# Patient Record
Sex: Male | Born: 1985 | Race: White | Hispanic: No | Marital: Married | Smoking: Never smoker
Health system: Southern US, Community
[De-identification: ages and names within clinical notes are randomized; demographics above are authoritative.]

---

## 2003-02-14 ENCOUNTER — Emergency Department (HOSPITAL_COMMUNITY): Admission: EM | Admit: 2003-02-14 | Discharge: 2003-02-14 | Payer: Self-pay | Admitting: Emergency Medicine

## 2004-01-04 ENCOUNTER — Emergency Department (HOSPITAL_COMMUNITY): Admission: EM | Admit: 2004-01-04 | Discharge: 2004-01-04 | Payer: Self-pay | Admitting: Emergency Medicine

## 2014-08-11 ENCOUNTER — Ambulatory Visit (INDEPENDENT_AMBULATORY_CARE_PROVIDER_SITE_OTHER): Payer: 59 | Admitting: Family Medicine

## 2014-08-11 VITALS — BP 132/86 | HR 85 | Temp 98.1°F | Resp 18 | Ht 70.75 in | Wt 200.4 lb

## 2014-08-11 DIAGNOSIS — R05 Cough: Secondary | ICD-10-CM

## 2014-08-11 DIAGNOSIS — R059 Cough, unspecified: Secondary | ICD-10-CM

## 2014-08-11 DIAGNOSIS — J209 Acute bronchitis, unspecified: Secondary | ICD-10-CM

## 2014-08-11 MED ORDER — HYDROCOD POLST-CHLORPHEN POLST 10-8 MG/5ML PO LQCR: 5.0000 mL | Freq: Every evening | ORAL | Status: DC | PRN

## 2014-08-11 NOTE — Progress Notes (Signed)
   Subjective:    Patient ID: Keith Hamilton, male    DOB: 02/23/1986, 28 y.o.   MRN: [id number]  HPI Patient with 1 1/2 weeks of clear nasal drainage/congestion and chest congestion. His cough has decreased over time, is now occasional with little sputum. He has some upper back pain with cough. Has had a feeling of tickling in his throat. Has taken some mucinex with some temporary relief. Has these symptoms in the spring and fall every year.   No past medical history on file. No past surgical history on file. No family history on file. History  Substance Use Topics  . Smoking status: Never Smoker   . Smokeless tobacco: Not on file  . Alcohol Use: No    Review of Systems No fever/chills, some sore throat at beginning, no ear pain, no persistent headache, some slight wheezing last night, no SOB, no chest pain. No muscle aches, no fatigue.     Objective:   Physical Exam  Vitals reviewed. Constitutional: He is oriented to person, place, and time. He appears well-developed and well-nourished.  HENT:  Head: Normocephalic and atraumatic.  Right Ear: Tympanic membrane, external ear and ear canal normal.  Left Ear: Tympanic membrane, external ear and ear canal normal.  Nose: Mucosal edema and rhinorrhea present. Right sinus exhibits no maxillary sinus tenderness and no frontal sinus tenderness. Left sinus exhibits no maxillary sinus tenderness and no frontal sinus tenderness.  Mouth/Throat: Uvula is midline. Posterior oropharyngeal erythema present. No oropharyngeal exudate, posterior oropharyngeal edema or tonsillar abscesses.  Nasal congestion and post nasal drainage present.    Eyes: Conjunctivae are normal.  Neck: Normal range of motion. Neck supple.  Cardiovascular: Normal rate, regular rhythm and normal heart sounds.   Pulmonary/Chest: Effort normal and breath sounds normal.  Musculoskeletal: Normal range of motion.  Lymphadenopathy:    He has no cervical adenopathy.    Neurological: He is alert and oriented to person, place, and time.  Skin: Skin is warm and dry.  Psychiatric: He has a normal mood and affect. His behavior is normal. Judgment and thought content normal.      Assessment & Plan:  1. Cough - chlorpheniramine-HYDROcodone (TUSSIONEX PENNKINETIC ER) 10-8 MG/5ML LQCR; Take 5 mLs by mouth at bedtime as needed.  Dispense: 70 mL; Refill: 0  2. Acute bronchitis, unspecified organism- this likely viral in healthy, 28 yo, nonsmoker. Provided written and verbal information regarding diagnosis and treatment. Patient instructions- Decongestant twice a day- morning and early afternoon Afrin nasal spray as directed on box up to 4 days Delsym cough syrup during day, prescription cough syrup at night Drink lots of fluids  -RTC if no improvement in 4-5 days, sooner if worsening symptoms- fever/chills, SOB  Emi Belfast, FNP-BC  [facility]  08/11/2014 8:02 PM 22

## 2014-08-11 NOTE — Patient Instructions (Signed)
Decongestant twice a day- morning and early afternoon Afrin nasal spray as directed on box up to 4 days Delsym cough syrup during day, prescription cough syrup at night Drink lots of fluids Acute Bronchitis Bronchitis is inflammation of the airways that extend from the windpipe into the lungs (bronchi). The inflammation often causes mucus to develop. This leads to a cough, which is the most common symptom of bronchitis.  In acute bronchitis, the condition usually develops suddenly and goes away over time, usually in a couple weeks. Smoking, allergies, and asthma can make bronchitis worse. Repeated episodes of bronchitis may cause further lung problems.  CAUSES Acute bronchitis is most often caused by the same virus that causes a cold. The virus can spread from person to person (contagious) through coughing, sneezing, and touching contaminated objects. SIGNS AND SYMPTOMS   Cough.   Fever.   Coughing up mucus.   Body aches.   Chest congestion.   Chills.   Shortness of breath.   Sore throat.  DIAGNOSIS  Acute bronchitis is usually diagnosed through a physical exam. Your health care provider will also ask you questions about your medical history. Tests, such as chest X-rays, are sometimes done to rule out other conditions.  TREATMENT  Acute bronchitis usually goes away in a couple weeks. Oftentimes, no medical treatment is necessary. Medicines are sometimes given for relief of fever or cough. Antibiotic medicines are usually not needed but may be prescribed in certain situations. In some cases, an inhaler may be recommended to help reduce shortness of breath and control the cough. A cool mist vaporizer may also be used to help thin bronchial secretions and make it easier to clear the chest.  HOME CARE INSTRUCTIONS  Get plenty of rest.   Drink enough fluids to keep your urine clear or pale yellow (unless you have a medical condition that requires fluid restriction). Increasing  fluids may help thin your respiratory secretions (sputum) and reduce chest congestion, and it will prevent dehydration.   Take medicines only as directed by your health care provider.  If you were prescribed an antibiotic medicine, finish it all even if you start to feel better.  Avoid smoking and secondhand smoke. Exposure to cigarette smoke or irritating chemicals will make bronchitis worse. If you are a smoker, consider using nicotine gum or skin patches to help control withdrawal symptoms. Quitting smoking will help your lungs heal faster.   Reduce the chances of another bout of acute bronchitis by washing your hands frequently, avoiding people with cold symptoms, and trying not to touch your hands to your mouth, nose, or eyes.   Keep all follow-up visits as directed by your health care provider.  SEEK MEDICAL CARE IF: Your symptoms do not improve after 1 week of treatment.  SEEK IMMEDIATE MEDICAL CARE IF:  You develop an increased fever or chills.   You have chest pain.   You have severe shortness of breath.  You have bloody sputum.   You develop dehydration.  You faint or repeatedly feel like you are going to pass out.  You develop repeated vomiting.  You develop a severe headache. MAKE SURE YOU:   Understand these instructions.  Will watch your condition.  Will get help right away if you are not doing well or get worse. Document Released: 12/08/2004 Document Revised: 03/17/2014 Document Reviewed: 04/23/2013 [facility] Patient Information 2015 [place], [place]. This information is not intended to replace advice given to you by your health care provider. Make sure you discuss  any questions you have with your health care provider.  

## 2017-06-19 ENCOUNTER — Encounter: Payer: Self-pay | Admitting: Emergency Medicine

## 2017-06-19 ENCOUNTER — Ambulatory Visit (INDEPENDENT_AMBULATORY_CARE_PROVIDER_SITE_OTHER): Payer: Managed Care, Other (non HMO) | Admitting: Emergency Medicine

## 2017-06-19 ENCOUNTER — Ambulatory Visit (INDEPENDENT_AMBULATORY_CARE_PROVIDER_SITE_OTHER): Payer: Managed Care, Other (non HMO)

## 2017-06-19 VITALS — BP 110/72 | HR 97 | Temp 99.4°F | Resp 16 | Ht 70.5 in | Wt 206.4 lb

## 2017-06-19 DIAGNOSIS — R091 Pleurisy: Secondary | ICD-10-CM | POA: Diagnosis not present

## 2017-06-19 DIAGNOSIS — B999 Unspecified infectious disease: Secondary | ICD-10-CM

## 2017-06-19 DIAGNOSIS — B349 Viral infection, unspecified: Secondary | ICD-10-CM

## 2017-06-19 DIAGNOSIS — R5081 Fever presenting with conditions classified elsewhere: Secondary | ICD-10-CM | POA: Diagnosis not present

## 2017-06-19 DIAGNOSIS — R0781 Pleurodynia: Secondary | ICD-10-CM | POA: Diagnosis not present

## 2017-06-19 DIAGNOSIS — M546 Pain in thoracic spine: Secondary | ICD-10-CM | POA: Diagnosis not present

## 2017-06-19 LAB — POCT CBC
Granulocyte percent: 64 %G (ref 37–80)
HCT, POC: 42.6 % — AB (ref 43.5–53.7)
Hemoglobin: 14.5 g/dL (ref 14.1–18.1)
Lymph, poc: 1.6 (ref 0.6–3.4)
MCH, POC: 30.9 pg (ref 27–31.2)
MCHC: 34.1 g/dL (ref 31.8–35.4)
MCV: 90.6 fL (ref 80–97)
MID (CBC): 0.4 (ref 0–0.9)
MPV: 7.6 fL (ref 0–99.8)
PLATELET COUNT, POC: 269 10*3/uL (ref 142–424)
POC Granulocyte: 3.6 (ref 2–6.9)
POC LYMPH PERCENT: 28.5 %L (ref 10–50)
POC MID %: 7.5 %M (ref 0–12)
RBC: 4.7 M/uL (ref 4.69–6.13)
RDW, POC: 12.9 %
WBC: 5.7 10*3/uL (ref 4.6–10.2)

## 2017-06-19 MED ORDER — AZITHROMYCIN 250 MG PO TABS: ORAL_TABLET | ORAL | 0 refills | Status: DC

## 2017-06-19 MED ORDER — PREDNISONE 20 MG PO TABS: 40.0000 mg | ORAL_TABLET | Freq: Every day | ORAL | 0 refills | Status: AC

## 2017-06-19 NOTE — Patient Instructions (Addendum)
     IF you received an x-ray today, you will receive an invoice from [facility]. Please contact [facility] at [phone] with questions or concerns regarding your invoice.   IF you received labwork today, you will receive an invoice from [facility]. Please contact [facility] at [phone] with questions or concerns regarding your invoice.   Our billing staff will not be able to assist you with questions regarding bills from these companies.  You will be contacted with the lab results as soon as they are available. The fastest way to get your results is to activate your My Chart account. Instructions are located on the last page of this paperwork. If you have not heard from us regarding the results in 2 weeks, please contact this office.      Pleurisy Pleurisy is irritation and swelling (inflammation) of the linings of your lungs (pleura). This can cause pain in your chest, back, or shoulder. It can also cause trouble breathing. Follow these instructions at home: Medicines  Take over-the-counter and prescription medicines only as told by your doctor.  If you were prescribed antibiotic medicine, take it as told by your doctor. Do not stop taking the antibiotic even if you start to feel better. Activity  Rest and return to your normal activities as told by your doctor. Ask your doctor what activities are safe for you.  Do not drive or use heavy machinery while taking prescription pain medicine. General instructions  Watch for any changes in your condition.  Take deep breaths often, even if it is painful. This can help prevent lung problems.  When lying down, lie on your painful side. This may help you feel less pain.  Do not smoke. If you need help quitting, ask your doctor.  Keep all follow-up visits as told by your doctor. This is important. Contact a doctor if:  You have pain that:  Gets worse.  Does not get better with medicine.  Lasts for more than  1 week.  You have a fever or chills.  You have a cough that does not get better at home.  You have trouble breathing that does not get better at home.  You cough up liquid that looks like pus (purulent secretions). Get help right away if:  Your lips, fingernails, or toenails turn dark or turn blue.  You cough up blood.  You have trouble breathing that gets worse.  You are making loud noises when you breathe (wheezing) and this gets worse.  You have pain that spreads to your neck, arms, or jaw.  You get a rash.  You throw up (vomit).  You pass out (faint). Summary  Pleurisy is irritation and swelling (inflammation) of the linings of your lungs (pleura).  Pleurisy can cause pain and trouble breathing.  If you have a cough that does not get better at home, contact your doctor.  Get help right away if you are having trouble breathing and it is getting worse. This information is not intended to replace advice given to you by your health care provider. Make sure you discuss any questions you have with your health care provider. Document Released: 10/13/2008 Document Revised: 07/25/2016 Document Reviewed: 07/25/2016 Elsevier Interactive Patient Education  2017 Elsevier Inc.  

## 2017-06-19 NOTE — Progress Notes (Signed)
Lb

## 2017-06-19 NOTE — Progress Notes (Signed)
Keith Hamilton 31 y.o.   Chief Complaint  Patient presents with  . Back Pain    per patient more so in the chest cavity x 6 days  . Shortness of Breath    with lying down with fever (100.6 last night)    HISTORY OF PRESENT ILLNESS: This is a 31 y.o. male complaining of pain to torso, front and back, worse when lying down x 6 days; improves sitting up; developed fever yesterday; no other significant symptoms.  HPI   Prior to Admission medications   Not on File    No Known Allergies  There are no active problems to display for this patient.   No past medical history on file.  No past surgical history on file.  Social History   Social History  . Marital status: Married    Spouse name: N/A  . Number of children: N/A  . Years of education: N/A   Occupational History  . Not on file.   Social History Main Topics  . Smoking status: Never Smoker  . Smokeless tobacco: Never Used  . Alcohol use No  . Drug use: No  . Sexual activity: Not on file   Other Topics Concern  . Not on file   Social History Narrative  . No narrative on file    No family history on file.   Review of Systems  Constitutional: Positive for fever and malaise/fatigue. Negative for chills and weight loss.  HENT: Positive for sore throat (better now). Negative for nosebleeds.   Eyes: Negative.  Negative for discharge and redness.  Respiratory: Negative.  Negative for cough, hemoptysis and shortness of breath.   Cardiovascular: Positive for chest pain. Negative for palpitations and leg swelling.  Gastrointestinal: Negative.  Negative for abdominal pain, diarrhea, nausea and vomiting.  Genitourinary: Negative.  Negative for dysuria and hematuria.  Musculoskeletal: Positive for back pain. Negative for joint pain and myalgias.  Skin: Negative.  Negative for rash.  Neurological: Positive for weakness. Negative for dizziness and headaches.  Endo/Heme/Allergies: Negative.   All other systems  reviewed and are negative.  Vitals:   06/19/17 0954  BP: 110/72  Pulse: 97  Resp: 16  Temp: 99.4 F (37.4 C)    Physical Exam  Constitutional: He is oriented to person, place, and time. He appears well-developed and well-nourished.  HENT:  Head: Normocephalic and atraumatic.  Nose: Nose normal.  Mouth/Throat: Oropharynx is clear and moist. No oropharyngeal exudate.  Eyes: Pupils are equal, round, and reactive to light. Conjunctivae and EOM are normal.  Neck: Normal range of motion. Neck supple. No JVD present. No thyromegaly present.  Cardiovascular: Normal rate, regular rhythm, normal heart sounds and intact distal pulses.   Pulmonary/Chest: Effort normal and breath sounds normal.  Abdominal: Soft. Bowel sounds are normal. He exhibits no distension and no mass. There is no tenderness. There is no rebound.  Musculoskeletal: Normal range of motion.  Lymphadenopathy:    He has no cervical adenopathy.  Neurological: He is alert and oriented to person, place, and time. No sensory deficit. He exhibits normal muscle tone.  Skin: Skin is warm and dry. Capillary refill takes less than 2 seconds. No rash noted.  Psychiatric: He has a normal mood and affect. His behavior is normal.  Vitals reviewed.  Dg Chest 2 View  Result Date: 06/19/2017 CLINICAL DATA:  Pleuritic chest pain EXAM: CHEST  2 VIEW COMPARISON:  12/20/2013 FINDINGS: The heart size and mediastinal contours are within normal limits. Both lungs  are clear. The visualized skeletal structures are unremarkable. IMPRESSION: No active cardiopulmonary disease. Electronically Signed   By: Alcide CleverMark  Lukens M.D.   On: 06/19/2017 10:43   Results for orders placed or performed in visit on 06/19/17 (from the past 24 hour(s))  POCT CBC     Status: Abnormal   Collection Time: 06/19/17 10:56 AM  Result Value Ref Range   WBC 5.7 4.6 - 10.2 K/uL   Lymph, poc 1.6 0.6 - 3.4   POC LYMPH PERCENT 28.5 10 - 50 %L   MID (cbc) 0.4 0 - 0.9   POC MID % 7.5  0 - 12 %M   POC Granulocyte 3.6 2 - 6.9   Granulocyte percent 64.0 37 - 80 %G   RBC 4.70 4.69 - 6.13 M/uL   Hemoglobin 14.5 14.1 - 18.1 g/dL   HCT, POC 09.842.6 (A) 11.943.5 - 53.7 %   MCV 90.6 80 - 97 fL   MCH, POC 30.9 27 - 31.2 pg   MCHC 34.1 31.8 - 35.4 g/dL   RDW, POC 14.712.9 %   Platelet Count, POC 269 142 - 424 K/uL   MPV 7.6 0 - 99.8 fL   EKG: NSR, no signs of pericarditis.  ASSESSMENT & PLAN: Christiane HaJonathan was seen today for back pain and shortness of breath.  Diagnoses and all orders for this visit:  Pleuritic chest pain -     Comprehensive metabolic panel -     POCT CBC -     DG Chest 2 View; Future -     EKG 12-Lead  Pleurisy  Acute bilateral thoracic back pain  Fever due to infection  Viral illness  Other orders -     azithromycin (ZITHROMAX) 250 MG tablet; Sig as indicated -     predniSONE (DELTASONE) 20 MG tablet; Take 2 tablets (40 mg total) by mouth daily with breakfast.    Patient Instructions       IF you received an x-ray today, you will receive an invoice from [facility]. Please contact [facility] at [phone] with questions or concerns regarding your invoice.   IF you received labwork today, you will receive an invoice from [facility]. Please contact [facility] at [phone] with questions or concerns regarding your invoice.   Our billing staff will not be able to assist you with questions regarding bills from these companies.  You will be contacted with the lab results as soon as they are available. The fastest way to get your results is to activate your My Chart account. Instructions are located on the last page of this paperwork. If you have not heard from us regarding the results in 2 weeks, please contact this office.     Pleurisy Pleurisy is irritation and swelling (inflammation) of the linings of your lungs (pleura). This can cause pain in your chest, back, or shoulder. It can also cause trouble breathing. Follow these  instructions at home: Medicines  Take over-the-counter and prescription medicines only as told by your doctor.  If you were prescribed antibiotic medicine, take it as told by your doctor. Do not stop taking the antibiotic even if you start to feel better. Activity  Rest and return to your normal activities as told by your doctor. Ask your doctor what activities are safe for you.  Do not drive or use heavy machinery while taking prescription pain medicine. General instructions  Watch for any changes in your condition.  Take deep breaths often, even if it is painful. This can help  prevent lung problems.  When lying down, lie on your painful side. This may help you feel less pain.  Do not smoke. If you need help quitting, ask your doctor.  Keep all follow-up visits as told by your doctor. This is important. Contact a doctor if:  You have pain that: ? Gets worse. ? Does not get better with medicine. ? Lasts for more than 1 week.  You have a fever or chills.  You have a cough that does not get better at home.  You have trouble breathing that does not get better at home.  You cough up liquid that looks like pus (purulent secretions). Get help right away if:  Your lips, fingernails, or toenails turn dark or turn blue.  You cough up blood.  You have trouble breathing that gets worse.  You are making loud noises when you breathe (wheezing) and this gets worse.  You have pain that spreads to your neck, arms, or jaw.  You get a rash.  You throw up (vomit).  You pass out (faint). Summary  Pleurisy is irritation and swelling (inflammation) of the linings of your lungs (pleura).  Pleurisy can cause pain and trouble breathing.  If you have a cough that does not get better at home, contact your doctor.  Get help right away if you are having trouble breathing and it is getting worse. This information is not intended to replace advice given to you by your health care  provider. Make sure you discuss any questions you have with your health care provider. Document Released: 10/13/2008 Document Revised: 07/25/2016 Document Reviewed: 07/25/2016 Elsevier Interactive Patient Education  2017 Elsevier Inc.      Edwina Barth, MD [facility]

## 2017-06-20 LAB — COMPREHENSIVE METABOLIC PANEL
ALT: 17 IU/L (ref 0–44)
AST: 17 IU/L (ref 0–40)
Albumin/Globulin Ratio: 1.6 (ref 1.2–2.2)
Albumin: 4.5 g/dL (ref 3.5–5.5)
Alkaline Phosphatase: 75 IU/L (ref 39–117)
BUN/Creatinine Ratio: 13 (ref 9–20)
BUN: 15 mg/dL (ref 6–20)
Bilirubin Total: 0.6 mg/dL (ref 0.0–1.2)
CO2: 25 mmol/L (ref 20–29)
Calcium: 9.4 mg/dL (ref 8.7–10.2)
Chloride: 100 mmol/L (ref 96–106)
Creatinine, Ser: 1.19 mg/dL (ref 0.76–1.27)
GFR calc Af Amer: 92 mL/min/{1.73_m2} (ref >59)
GFR calc non Af Amer: 80 mL/min/{1.73_m2} (ref >59)
GLUCOSE: 78 mg/dL (ref 65–99)
Globulin, Total: 2.9 g/dL (ref 1.5–4.5)
Potassium: 3.5 mmol/L (ref 3.5–5.2)
Sodium: 140 mmol/L (ref 134–144)
TOTAL PROTEIN: 7.4 g/dL (ref 6.0–8.5)

## 2017-06-21 ENCOUNTER — Encounter: Payer: Self-pay | Admitting: Radiology

## 2017-08-25 ENCOUNTER — Encounter: Payer: Self-pay | Admitting: Emergency Medicine

## 2017-08-25 ENCOUNTER — Ambulatory Visit (INDEPENDENT_AMBULATORY_CARE_PROVIDER_SITE_OTHER): Payer: Managed Care, Other (non HMO) | Admitting: Emergency Medicine

## 2017-08-25 VITALS — BP 116/74 | HR 74 | Temp 98.8°F | Resp 16 | Ht 71.0 in | Wt 196.6 lb

## 2017-08-25 DIAGNOSIS — Z23 Encounter for immunization: Secondary | ICD-10-CM

## 2017-08-25 DIAGNOSIS — Z0000 Encounter for general adult medical examination without abnormal findings: Secondary | ICD-10-CM | POA: Diagnosis not present

## 2017-08-25 NOTE — Progress Notes (Signed)
Keith Hamilton 31 y.o.   Chief Complaint  Patient presents with  . Annual Exam    HISTORY OF PRESENT ILLNESS: This is a 31 y.o. male here for annual exam. No complaints or medical concerns. Pertinent labs & imaging results that were available during my care of the patient were reviewed by me and considered in my medical decision making (see chart for details). Smoking:none Drinking:no Sleeping:good habits Work:regular hours Exercise:adequate Stress:low Nutrition:very good; has lost 27 lbs in past 90 days with improved eating habits.    HPI   Prior to Admission medications   Not on File    No Known Allergies  Patient Active Problem List   Diagnosis Date Noted  . Pleuritic chest pain 06/19/2017  . Pleurisy 06/19/2017  . Acute bilateral thoracic back pain 06/19/2017  . Fever due to infection 06/19/2017  . Viral illness 06/19/2017    No past medical history on file.  No past surgical history on file.  Social History   Social History  . Marital status: Married    Spouse name: N/A  . Number of children: N/A  . Years of education: N/A   Occupational History  . Not on file.   Social History Main Topics  . Smoking status: Never Smoker  . Smokeless tobacco: Never Used  . Alcohol use No  . Drug use: No  . Sexual activity: Not on file   Other Topics Concern  . Not on file   Social History Narrative  . No narrative on file    No family history on file.   Review of Systems  Constitutional: Negative.  Negative for chills, fever and weight loss.  HENT: Negative.  Negative for hearing loss, nosebleeds and sore throat.   Eyes: Negative.  Negative for blurred vision, double vision, discharge and redness.  Respiratory: Negative.  Negative for cough and shortness of breath.   Cardiovascular: Negative.  Negative for chest pain, palpitations, claudication and leg swelling.  Gastrointestinal: Negative.  Negative for abdominal pain, blood in stool, diarrhea,  melena, nausea and vomiting.  Genitourinary: Negative.  Negative for dysuria and hematuria.  Musculoskeletal: Negative.  Negative for back pain, myalgias and neck pain.  Skin: Negative.  Negative for rash.  Neurological: Negative.  Negative for dizziness and headaches.  Endo/Heme/Allergies: Negative.   All other systems reviewed and are negative.  Vitals:   08/25/17 0837  BP: 116/74  Pulse: 74  Resp: 16  Temp: 98.8 F (37.1 C)  SpO2: 99%     Physical Exam  Constitutional: He is oriented to person, place, and time. He appears well-developed and well-nourished.  HENT:  Head: Normocephalic and atraumatic.  Right Ear: External ear normal.  Left Ear: External ear normal.  Nose: Nose normal.  Mouth/Throat: Oropharynx is clear and moist.  Eyes: Pupils are equal, round, and reactive to light. Conjunctivae and EOM are normal.  Neck: Normal range of motion. Neck supple. No JVD present. No thyromegaly present.  Cardiovascular: Normal rate, regular rhythm, normal heart sounds and intact distal pulses.   Pulmonary/Chest: Effort normal and breath sounds normal.  Abdominal: Soft. Bowel sounds are normal. He exhibits no distension and no mass. There is no tenderness. No hernia.  Musculoskeletal: Normal range of motion.  Lymphadenopathy:    He has no cervical adenopathy.  Neurological: He is alert and oriented to person, place, and time.  Skin: Skin is warm and dry. Capillary refill takes less than 2 seconds. No rash noted.  Psychiatric: He has a normal  mood and affect. His behavior is normal.  Vitals reviewed.    ASSESSMENT & PLAN: [name] was seen today for annual exam.  Diagnoses and all orders for this visit:  Routine general medical examination at a health care facility  Other orders -     Tdap vaccine greater than or equal to 7yo IM    Patient Instructions       IF you received an x-ray today, you will receive an invoice from [facility]. Please contact  [facility] at [phone] with questions or concerns regarding your invoice.   IF you received labwork today, you will receive an invoice from [place]. Please contact [facility] at [phone] with questions or concerns regarding your invoice.   Our billing staff will not be able to assist you with questions regarding bills from these companies.  You will be contacted with the lab results as soon as they are available. The fastest way to get your results is to activate your My Chart account. Instructions are located on the last page of this paperwork. If you have not heard from [place] regarding the results in 2 weeks, please contact this office.        Health Maintenance, Male A healthy lifestyle and preventive care is important for your health and wellness. Ask your health care provider about what schedule of regular examinations is right for you. What should I know about weight and diet? Eat a Healthy Diet  Eat plenty of vegetables, fruits, whole grains, low-fat dairy products, and lean protein.  Do not eat a lot of foods high in solid fats, added sugars, or salt.  Maintain a Healthy Weight Regular exercise can help you achieve or maintain a healthy weight. You should:  Do at least 150 minutes of exercise each week. The exercise should increase your heart rate and make you sweat (moderate-intensity exercise).  Do strength-training exercises at least twice a week.  Watch Your Levels of Cholesterol and Blood Lipids  Have your blood tested for lipids and cholesterol every 5 years starting at 33 years of age. If you are at high risk for heart disease, you should start having your blood tested when you are 33 years old. You may need to have your cholesterol levels checked more often if: ? Your lipid or cholesterol levels are high. ? You are older than 33 years of age. ? You are at high risk for heart disease.  What should I know about cancer screening? Many types of cancers  can be detected early and may often be prevented. Lung Cancer  You should be screened every year for lung cancer if: ? You are a current smoker who has smoked for at least 30 years. ? You are a former smoker who has quit within the past 15 years.  Talk to your health care provider about your screening options, when you should start screening, and how often you should be screened.  Colorectal Cancer  Routine colorectal cancer screening usually begins at 33 years of age and should be repeated every 5-10 years until you are 33 years old. You may need to be screened more often if early forms of precancerous polyps or small growths are found. Your health care provider may recommend screening at an earlier age if you have risk factors for colon cancer.  Your health care provider may recommend using home test kits to check for hidden blood in the stool.  A small camera at the end of a tube can be used to  examine your colon (sigmoidoscopy or colonoscopy). This checks for the earliest forms of colorectal cancer.  Prostate and Testicular Cancer  Depending on your age and overall health, your health care provider may do certain tests to screen for prostate and testicular cancer.  Talk to your health care provider about any symptoms or concerns you have about testicular or prostate cancer.  Skin Cancer  Check your skin from head to toe regularly.  Tell your health care provider about any new moles or changes in moles, especially if: ? There is a change in a mole's size, shape, or color. ? You have a mole that is larger than a pencil eraser.  Always use sunscreen. Apply sunscreen liberally and repeat throughout the day.  Protect yourself by wearing long sleeves, pants, a wide-brimmed hat, and sunglasses when outside.  What should I know about heart disease, diabetes, and high blood pressure?  If you are 20-62 years of age, have your blood pressure checked every 3-5 years. If you are 10 years  of age or older, have your blood pressure checked every year. You should have your blood pressure measured twice-once when you are at a hospital or clinic, and once when you are not at a hospital or clinic. Record the average of the two measurements. To check your blood pressure when you are not at a hospital or clinic, you can use: ? An automated blood pressure machine at a pharmacy. ? A home blood pressure monitor.  Talk to your health care provider about your target blood pressure.  If you are between 52-82 years old, ask your health care provider if you should take aspirin to prevent heart disease.  Have regular diabetes screenings by checking your fasting blood sugar level. ? If you are at a normal weight and have a low risk for diabetes, have this test once every three years after the age of 53. ? If you are overweight and have a high risk for diabetes, consider being tested at a younger age or more often.  A one-time screening for abdominal aortic aneurysm (AAA) by ultrasound is recommended for men aged 65-75 years who are current or former smokers. What should I know about preventing infection? Hepatitis B If you have a higher risk for hepatitis B, you should be screened for this virus. Talk with your health care provider to find out if you are at risk for hepatitis B infection. Hepatitis C Blood testing is recommended for:  Everyone born from [birth year] through 1965.  Anyone with known risk factors for hepatitis C.  Sexually Transmitted Diseases (STDs)  You should be screened each year for STDs including gonorrhea and chlamydia if: ? You are sexually active and are younger than 33 years of age. ? You are older than 33 years of age and your health care provider tells you that you are at risk for this type of infection. ? Your sexual activity has changed since you were last screened and you are at an increased risk for chlamydia or gonorrhea. Ask your health care provider if you are at  risk.  Talk with your health care provider about whether you are at high risk of being infected with HIV. Your health care provider may recommend a prescription medicine to help prevent HIV infection.  What else can I do?  Schedule regular health, dental, and eye exams.  Stay current with your vaccines (immunizations).  Do not use any tobacco products, such as cigarettes, chewing tobacco, and e-cigarettes. If you need  help quitting, ask your health care provider.  Limit alcohol intake to no more than 2 drinks per day. One drink equals 12 ounces of beer, 5 ounces of wine, or 1 ounces of hard liquor.  Do not use street drugs.  Do not share needles.  Ask your health care provider for help if you need support or information about quitting drugs.  Tell your health care provider if you often feel depressed.  Tell your health care provider if you have ever been abused or do not feel safe at home. This information is not intended to replace advice given to you by your health care provider. Make sure you discuss any questions you have with your health care provider. Document Released: 04/28/2008 Document Revised: 06/29/2016 Document Reviewed: 08/04/2015 Elsevier Interactive Patient Education  2018 [employer].  American Heart Association (AHA) Exercise Recommendation  Being physically active is important to prevent heart disease and stroke, the nation's No. 1and No. 5killers. To improve overall cardiovascular health, we suggest at least 150 minutes per week of moderate exercise or 75 minutes per week of vigorous exercise (or a combination of moderate and vigorous activity). Thirty minutes a day, five times a week is an easy goal to remember. You will also experience benefits even if you divide your time into two or three segments of 10 to 15 minutes per day.  For people who would benefit from lowering their blood pressure or cholesterol, we recommend 40 minutes of aerobic exercise of  moderate to vigorous intensity three to four times a week to lower the risk for heart attack and stroke.  Physical activity is anything that makes you move your body and burn calories.  This includes things like climbing stairs or playing sports. Aerobic exercises benefit your heart, and include walking, jogging, swimming or biking. Strength and stretching exercises are best for overall stamina and flexibility.  The simplest, positive change you can make to effectively improve your heart health is to start walking. It's enjoyable, free, easy, social and great exercise. A walking program is flexible and boasts high success rates because people can stick with it. It's easy for walking to become a regular and satisfying part of life.   For Overall Cardiovascular Health:  At least 30 minutes of moderate-intensity aerobic activity at least 5 days per week for a total of 150  OR   At least 25 minutes of vigorous aerobic activity at least 3 days per week for a total of 75 minutes; or a combination of moderate- and vigorous-intensity aerobic activity  AND   Moderate- to high-intensity muscle-strengthening activity at least 2 days per week for additional health benefits.  For Lowering Blood Pressure and Cholesterol  An average 40 minutes of moderate- to vigorous-intensity aerobic activity 3 or 4 times per week  What if I can't make it to the time goal? Something is always better than nothing! And everyone has to start somewhere. Even if you've been sedentary for years, today is the day you can begin to make healthy changes in your life. If you don't think you'll make it for 30 or 40 minutes, set a reachable goal for today. You can work up toward your overall goal by increasing your time as you get stronger. Don't let all-or-nothing thinking rob you of doing what you can every day.  Source:[email or web address], MD [facility]

## 2017-08-25 NOTE — Patient Instructions (Addendum)
   IF you received an x-ray today, you will receive an invoice from [facility]. Please contact [facility] at [phone] with questions or concerns regarding your invoice.   IF you received labwork today, you will receive an invoice from [facility]. Please contact [facility] at [phone] with questions or concerns regarding your invoice.   Our billing staff will not be able to assist you with questions regarding bills from these companies.  You will be contacted with the lab results as soon as they are available. The fastest way to get your results is to activate your My Chart account. Instructions are located on the last page of this paperwork. If you have not heard from us regarding the results in 2 weeks, please contact this office.      Health Maintenance, Male A healthy lifestyle and preventive care is important for your health and wellness. Ask your health care provider about what schedule of regular examinations is right for you. What should I know about weight and diet? Eat a Healthy Diet  Eat plenty of vegetables, fruits, whole grains, low-fat dairy products, and lean protein.  Do not eat a lot of foods high in solid fats, added sugars, or salt.  Maintain a Healthy Weight Regular exercise can help you achieve or maintain a healthy weight. You should:  Do at least 150 minutes of exercise each week. The exercise should increase your heart rate and make you sweat (moderate-intensity exercise).  Do strength-training exercises at least twice a week.  Watch Your Levels of Cholesterol and Blood Lipids  Have your blood tested for lipids and cholesterol every 5 years starting at 33 years of age. If you are at high risk for heart disease, you should start having your blood tested when you are 33 years old. You may need to have your cholesterol levels checked more often if: ? Your lipid or cholesterol levels are high. ? You are older than 33 years of age. ? You  are at high risk for heart disease.  What should I know about cancer screening? Many types of cancers can be detected early and may often be prevented. Lung Cancer  You should be screened every year for lung cancer if: ? You are a current smoker who has smoked for at least 30 years. ? You are a former smoker who has quit within the past 15 years.  Talk to your health care provider about your screening options, when you should start screening, and how often you should be screened.  Colorectal Cancer  Routine colorectal cancer screening usually begins at 33 years of age and should be repeated every 5-10 years until you are 33 years old. You may need to be screened more often if early forms of precancerous polyps or small growths are found. Your health care provider may recommend screening at an earlier age if you have risk factors for colon cancer.  Your health care provider may recommend using home test kits to check for hidden blood in the stool.  A small camera at the end of a tube can be used to examine your colon (sigmoidoscopy or colonoscopy). This checks for the earliest forms of colorectal cancer.  Prostate and Testicular Cancer  Depending on your age and overall health, your health care provider may do certain tests to screen for prostate and testicular cancer.  Talk to your health care provider about any symptoms or concerns you have about testicular or prostate cancer.  Skin Cancer  Check your skin   from head to toe regularly.  Tell your health care provider about any new moles or changes in moles, especially if: ? There is a change in a mole's size, shape, or color. ? You have a mole that is larger than a pencil eraser.  Always use sunscreen. Apply sunscreen liberally and repeat throughout the day.  Protect yourself by wearing long sleeves, pants, a wide-brimmed hat, and sunglasses when outside.  What should I know about heart disease, diabetes, and high blood  pressure?  If you are 18-39 years of age, have your blood pressure checked every 3-5 years. If you are 40 years of age or older, have your blood pressure checked every year. You should have your blood pressure measured twice-once when you are at a hospital or clinic, and once when you are not at a hospital or clinic. Record the average of the two measurements. To check your blood pressure when you are not at a hospital or clinic, you can use: ? An automated blood pressure machine at a pharmacy. ? A home blood pressure monitor.  Talk to your health care provider about your target blood pressure.  If you are between 45-79 years old, ask your health care provider if you should take aspirin to prevent heart disease.  Have regular diabetes screenings by checking your fasting blood sugar level. ? If you are at a normal weight and have a low risk for diabetes, have this test once every three years after the age of 45. ? If you are overweight and have a high risk for diabetes, consider being tested at a younger age or more often.  A one-time screening for abdominal aortic aneurysm (AAA) by ultrasound is recommended for men aged 65-75 years who are current or former smokers. What should I know about preventing infection? Hepatitis B If you have a higher risk for hepatitis B, you should be screened for this virus. Talk with your health care provider to find out if you are at risk for hepatitis B infection. Hepatitis C Blood testing is recommended for:  Everyone born from 1945 through 1965.  Anyone with known risk factors for hepatitis C.  Sexually Transmitted Diseases (STDs)  You should be screened each year for STDs including gonorrhea and chlamydia if: ? You are sexually active and are younger than 33 years of age. ? You are older than 33 years of age and your health care provider tells you that you are at risk for this type of infection. ? Your sexual activity has changed since you were last  screened and you are at an increased risk for chlamydia or gonorrhea. Ask your health care provider if you are at risk.  Talk with your health care provider about whether you are at high risk of being infected with HIV. Your health care provider may recommend a prescription medicine to help prevent HIV infection.  What else can I do?  Schedule regular health, dental, and eye exams.  Stay current with your vaccines (immunizations).  Do not use any tobacco products, such as cigarettes, chewing tobacco, and e-cigarettes. If you need help quitting, ask your health care provider.  Limit alcohol intake to no more than 2 drinks per day. One drink equals 12 ounces of beer, 5 ounces of wine, or 1 ounces of hard liquor.  Do not use street drugs.  Do not share needles.  Ask your health care provider for help if you need support or information about quitting drugs.  Tell your health care   provider if you often feel depressed.  Tell your health care provider if you have ever been abused or do not feel safe at home. This information is not intended to replace advice given to you by your health care provider. Make sure you discuss any questions you have with your health care provider. Document Released: 04/28/2008 Document Revised: 06/29/2016 Document Reviewed: 08/04/2015 Elsevier Interactive Patient Education  2018 [employer]  American Heart Association (AHA) Exercise Recommendation  Being physically active is important to prevent heart disease and stroke, the nation's No. 1and No. 5killers. To improve overall cardiovascular health, we suggest at least 150 minutes per week of moderate exercise or 75 minutes per week of vigorous exercise (or a combination of moderate and vigorous activity). Thirty minutes a day, five times a week is an easy goal to remember. You will also experience benefits even if you divide your time into two or three segments of 10 to 15 minutes per day.  For people who would  benefit from lowering their blood pressure or cholesterol, we recommend 40 minutes of aerobic exercise of moderate to vigorous intensity three to four times a week to lower the risk for heart attack and stroke.  Physical activity is anything that makes you move your body and burn calories.  This includes things like climbing stairs or playing sports. Aerobic exercises benefit your heart, and include walking, jogging, swimming or biking. Strength and stretching exercises are best for overall stamina and flexibility.  The simplest, positive change you can make to effectively improve your heart health is to start walking. It's enjoyable, free, easy, social and great exercise. A walking program is flexible and boasts high success rates because people can stick with it. It's easy for walking to become a regular and satisfying part of life.   For Overall Cardiovascular Health:  At least 30 minutes of moderate-intensity aerobic activity at least 5 days per week for a total of 150  OR   At least 25 minutes of vigorous aerobic activity at least 3 days per week for a total of 75 minutes; or a combination of moderate- and vigorous-intensity aerobic activity  AND   Moderate- to high-intensity muscle-strengthening activity at least 2 days per week for additional health benefits.  For Lowering Blood Pressure and Cholesterol  An average 40 minutes of moderate- to vigorous-intensity aerobic activity 3 or 4 times per week  What if I can't make it to the time goal? Something is always better than nothing! And everyone has to start somewhere. Even if you've been sedentary for years, today is the day you can begin to make healthy changes in your life. If you don't think you'll make it for 30 or 40 minutes, set a reachable goal for today. You can work up toward your overall goal by increasing your time as you get stronger. Don't let all-or-nothing thinking rob you of doing what you can every day.   Source:http://www.heart.org    

## 2017-12-22 ENCOUNTER — Ambulatory Visit (INDEPENDENT_AMBULATORY_CARE_PROVIDER_SITE_OTHER): Payer: Managed Care, Other (non HMO) | Admitting: Emergency Medicine

## 2017-12-22 ENCOUNTER — Encounter: Payer: Self-pay | Admitting: Emergency Medicine

## 2017-12-22 VITALS — BP 122/72 | HR 93 | Temp 99.3°F | Resp 17 | Ht 72.5 in | Wt 200.0 lb

## 2017-12-22 DIAGNOSIS — J111 Influenza due to unidentified influenza virus with other respiratory manifestations: Secondary | ICD-10-CM | POA: Diagnosis not present

## 2017-12-22 DIAGNOSIS — R6889 Other general symptoms and signs: Secondary | ICD-10-CM | POA: Diagnosis not present

## 2017-12-22 LAB — POC INFLUENZA A&B (BINAX/QUICKVUE)
Influenza A, POC: NEGATIVE
Influenza B, POC: NEGATIVE

## 2017-12-22 MED ORDER — PREDNISONE 20 MG PO TABS: 40.0000 mg | ORAL_TABLET | Freq: Every day | ORAL | 0 refills | Status: AC

## 2017-12-22 MED ORDER — PSEUDOEPHEDRINE-GUAIFENESIN ER 60-600 MG PO TB12: 1.0000 | ORAL_TABLET | Freq: Two times a day (BID) | ORAL | 1 refills | Status: AC

## 2017-12-22 NOTE — Patient Instructions (Addendum)
     IF you received an x-ray today, you will receive an invoice from [facility]. Please contact [facility] at [phone] with questions or concerns regarding your invoice.   IF you received labwork today, you will receive an invoice from [facility]. Please contact [facility] at [phone] with questions or concerns regarding your invoice.   Our billing staff will not be able to assist you with questions regarding bills from these companies.  You will be contacted with the lab results as soon as they are available. The fastest way to get your results is to activate your My Chart account. Instructions are located on the last page of this paperwork. If you have not heard from us regarding the results in 2 weeks, please contact this office.      Influenza, Adult Influenza ("the flu") is an infection in the lungs, nose, and throat (respiratory tract). It is caused by a virus. The flu causes many common cold symptoms, as well as a high fever and body aches. It can make you feel very sick. The flu spreads easily from person to person (is contagious). Getting a flu shot (influenza vaccination) every year is the best way to prevent the flu. Follow these instructions at home:  Take over-the-counter and prescription medicines only as told by your doctor.  Use a cool mist humidifier to add moisture (humidity) to the air in your home. This can make it easier to breathe.  Rest as needed.  Drink enough fluid to keep your pee (urine) clear or pale yellow.  Cover your mouth and nose when you cough or sneeze.  Wash your hands with soap and water often, especially after you cough or sneeze. If you cannot use soap and water, use hand sanitizer.  Stay home from work or school as told by your doctor. Unless you are visiting your doctor, try to avoid leaving home until your fever has been gone for 24 hours without the use of medicine.  Keep all follow-up visits as told by your doctor.  This is important. How is this prevented?  Getting a yearly (annual) flu shot is the best way to avoid getting the flu. You may get the flu shot in late summer, fall, or winter. Ask your doctor when you should get your flu shot.  Wash your hands often or use hand sanitizer often.  Avoid contact with people who are sick during cold and flu season.  Eat healthy foods.  Drink plenty of fluids.  Get enough sleep.  Exercise regularly. Contact a doctor if:  You get new symptoms.  You have:  Chest pain.  Watery poop (diarrhea).  A fever.  Your cough gets worse.  You start to have more mucus.  You feel sick to your stomach (nauseous).  You throw up (vomit). Get help right away if:  You start to be short of breath or have trouble breathing.  Your skin or nails turn a bluish color.  You have very bad pain or stiffness in your neck.  You get a sudden headache.  You get sudden pain in your face or ear.  You cannot stop throwing up. This information is not intended to replace advice given to you by your health care provider. Make sure you discuss any questions you have with your health care provider. Document Released: 08/09/2008 Document Revised: 04/07/2016 Document Reviewed: 08/25/2015 Elsevier Interactive Patient Education  2017 Elsevier Inc.  

## 2017-12-22 NOTE — Progress Notes (Signed)
Keith Hamilton 32 y.o.   Chief Complaint  Patient presents with  . Fever  . URI  . Sinusitis    HISTORY OF PRESENT ILLNESS: This is a 32 y.o. male complaining of flu-like symptoms x 4 days; complaining of nasal congestion.  56-year-old daughter recently diagnosed with the flu.  Taking Tamiflu now for 4 days.  Still feels very congested.  Slightly better.  Influenza  This is a new problem. The current episode started in the past 7 days. The problem has been gradually improving. Associated symptoms include chills, congestion, coughing, headaches, myalgias and weakness. Pertinent negatives include no abdominal pain, anorexia, arthralgias, chest pain, nausea, numbness, rash, sore throat, urinary symptoms, vertigo or vomiting. Treatments tried: Tamiflu.     Prior to Admission medications   Medication Sig Start Date End Date Taking? Authorizing Provider  oseltamivir (TAMIFLU) 75 MG capsule Take 75 mg by mouth.   Yes [provider]    No Known Allergies  Patient Active Problem List   Diagnosis Date Noted  . Routine general medical examination at a health care facility 08/25/2017  . Pleuritic chest pain 06/19/2017  . Pleurisy 06/19/2017  . Acute bilateral thoracic back pain 06/19/2017  . Fever due to infection 06/19/2017  . Viral illness 06/19/2017    No past medical history on file.    Social History   Socioeconomic History  . Marital status: Married    Spouse name: Not on file  . Number of children: Not on file  . Years of education: Not on file  . Highest education level: Not on file  Social Needs  . Financial resource strain: Not on file  . Food insecurity - worry: Not on file  . Food insecurity - inability: Not on file  . Transportation needs - medical: Not on file  . Transportation needs - non-medical: Not on file  Occupational History  . Not on file  Tobacco Use  . Smoking status: Never Smoker  . Smokeless tobacco: Never Used  Substance and Sexual  Activity  . Alcohol use: No  . Drug use: No  . Sexual activity: Not on file  Other Topics Concern  . Not on file  Social History Narrative  . Not on file    No family history on file.   Review of Systems  Constitutional: Positive for chills.  HENT: Positive for congestion. Negative for sore throat.   Eyes: Negative.   Respiratory: Positive for cough.   Cardiovascular: Negative for chest pain.  Gastrointestinal: Negative.  Negative for abdominal pain, anorexia, nausea and vomiting.  Genitourinary: Negative.   Musculoskeletal: Positive for myalgias. Negative for arthralgias.  Skin: Negative for rash.  Neurological: Positive for weakness and headaches. Negative for vertigo and numbness.  Endo/Heme/Allergies: Negative.   All other systems reviewed and are negative.  Vitals:   12/22/17 1121  BP: 122/72  Pulse: 93  Resp: 17  Temp: 99.3 F (37.4 C)  SpO2: 98%     Physical Exam  Constitutional: He is oriented to person, place, and time. He appears well-developed and well-nourished.  HENT:  Head: Normocephalic and atraumatic.  Right Ear: External ear normal.  Left Ear: External ear normal.  Nose: Nose normal.  Mouth/Throat: Oropharynx is clear and moist.  Eyes: Conjunctivae and EOM are normal. Pupils are equal, round, and reactive to light.  Neck: Normal range of motion. Neck supple. No JVD present.  Cardiovascular: Normal rate, regular rhythm and normal heart sounds.  Pulmonary/Chest: Effort normal and breath sounds  normal.  Musculoskeletal: Normal range of motion.  Lymphadenopathy:    He has no cervical adenopathy.  Neurological: He is alert and oriented to person, place, and time. No sensory deficit. He exhibits normal muscle tone.  Skin: Skin is warm and dry. Capillary refill takes less than 2 seconds. No rash noted.  Vitals reviewed.    Results for orders placed or performed in visit on 12/22/17 (from the past 24 hour(s))  POC Influenza A&B(BINAX/QUICKVUE)      Status: None   Collection Time: 12/22/17 12:02 PM  Result Value Ref Range   Influenza A, POC Negative Negative   Influenza B, POC Negative Negative     ASSESSMENT & PLAN: Keith Hamilton was seen today for fever, uri and sinusitis.  Diagnoses and all orders for this visit:  Flu-like symptoms -     POC Influenza A&B(BINAX/QUICKVUE) -     predniSONE (DELTASONE) 20 MG tablet; Take 2 tablets (40 mg total) by mouth daily with breakfast for 5 days. -     pseudoephedrine-guaifenesin (MUCINEX D) 60-600 MG 12 hr tablet; Take 1 tablet by mouth every 12 (twelve) hours for 5 days.  Influenza    Patient Instructions       IF you received an x-ray today, you will receive an invoice from [facility]. Please contact [facility] at [phone] with questions or concerns regarding your invoice.   IF you received labwork today, you will receive an invoice from [facility]. Please contact [facility] at [phone] with questions or concerns regarding your invoice.   Our billing staff will not be able to assist you with questions regarding bills from these companies.  You will be contacted with the lab results as soon as they are available. The fastest way to get your results is to activate your My Chart account. Instructions are located on the last page of this paperwork. If you have not heard from us regarding the results in 2 weeks, please contact this office.      Influenza, Adult Influenza ("the flu") is an infection in the lungs, nose, and throat (respiratory tract). It is caused by a virus. The flu causes many common cold symptoms, as well as a high fever and body aches. It can make you feel very sick. The flu spreads easily from person to person (is contagious). Getting a flu shot (influenza vaccination) every year is the best way to prevent the flu. Follow these instructions at home:  Take over-the-counter and prescription medicines only as told by your doctor.  Use a cool  mist humidifier to add moisture (humidity) to the air in your home. This can make it easier to breathe.  Rest as needed.  Drink enough fluid to keep your pee (urine) clear or pale yellow.  Cover your mouth and nose when you cough or sneeze.  Wash your hands with soap and water often, especially after you cough or sneeze. If you cannot use soap and water, use hand sanitizer.  Stay home from work or school as told by your doctor. Unless you are visiting your doctor, try to avoid leaving home until your fever has been gone for 24 hours without the use of medicine.  Keep all follow-up visits as told by your doctor. This is important. How is this prevented?  Getting a yearly (annual) flu shot is the best way to avoid getting the flu. You may get the flu shot in late summer, fall, or winter. Ask your doctor when you should get your flu shot.  Wash your hands often or use hand sanitizer often.  Avoid contact with people who are sick during cold and flu season.  Eat healthy foods.  Drink plenty of fluids.  Get enough sleep.  Exercise regularly. Contact a doctor if:  You get new symptoms.  You have: ? Chest pain. ? Watery poop (diarrhea). ? A fever.  Your cough gets worse.  You start to have more mucus.  You feel sick to your stomach (nauseous).  You throw up (vomit). Get help right away if:  You start to be short of breath or have trouble breathing.  Your skin or nails turn a bluish color.  You have very bad pain or stiffness in your neck.  You get a sudden headache.  You get sudden pain in your face or ear.  You cannot stop throwing up. This information is not intended to replace advice given to you by your health care provider. Make sure you discuss any questions you have with your health care provider. Document Released: 08/09/2008 Document Revised: 04/07/2016 Document Reviewed: 08/25/2015 Elsevier Interactive Patient Education  2017 Elsevier  Inc.      Edwina Barth, MD [facility]

## 2018-06-11 ENCOUNTER — Ambulatory Visit (INDEPENDENT_AMBULATORY_CARE_PROVIDER_SITE_OTHER): Payer: Managed Care, Other (non HMO) | Admitting: Family Medicine

## 2018-06-11 ENCOUNTER — Other Ambulatory Visit: Payer: Self-pay

## 2018-06-11 ENCOUNTER — Encounter: Payer: Self-pay | Admitting: Family Medicine

## 2018-06-11 VITALS — BP 138/84 | HR 76 | Temp 98.7°F | Ht 71.0 in | Wt 204.4 lb

## 2018-06-11 DIAGNOSIS — R21 Rash and other nonspecific skin eruption: Secondary | ICD-10-CM

## 2018-06-11 MED ORDER — CLOTRIMAZOLE-BETAMETHASONE 1-0.05 % EX CREA: 1.0000 "application " | TOPICAL_CREAM | Freq: Two times a day (BID) | CUTANEOUS | 0 refills | Status: DC

## 2018-06-11 NOTE — Progress Notes (Signed)
Subjective:  By signing my name below, I, Greer Ee, attest that this documentation has been prepared under the direction and in the presence of Shade Flood, MD Electronically Signed: Greer Ee Medical Scribe 06/11/2018 at 12:23 PM   Patient ID: Keith Hamilton, male    DOB: Jun 29, 1986, 32 y.o.   MRN: [id number] Chief Complaint  Patient presents with  . Rash    in groin area-going on 2 weeks. (had Hand,Foot, & Mouth)    HPI Keith Hamilton is a 32 y.o. male who presents to Primary Care at [facility] complaining of a genital rash. He reports a history of having a hand, foot and mouth disease, and reported having blisters. He report onset was 4 days ago, and rash in groin on 1-2 days before pain in hands and feet. Denies having a history of sexual transmitted diseases, and reports that rash rash is just on the exterior of groin.  Attempted treatments Patient reports that he has tried baby powder, barrier cream, Aquaphor and antifungal creme for the past two days only.  Patient Active Problem List   Diagnosis Date Noted  . Flu-like symptoms 12/22/2017  . Influenza 12/22/2017  . Routine general medical examination at a health care facility 08/25/2017  . Pleuritic chest pain 06/19/2017  . Pleurisy 06/19/2017  . Acute bilateral thoracic back pain 06/19/2017  . Fever due to infection 06/19/2017  . Viral illness 06/19/2017   No past medical history on file. No past surgical history on file. No Known Allergies Prior to Admission medications   Medication Sig Start Date End Date Taking? Authorizing Provider  oseltamivir (TAMIFLU) 75 MG capsule Take 75 mg by mouth.    [provider]   Social History   Socioeconomic History  . Marital status: Married    Spouse name: Not on file  . Number of children: Not on file  . Years of education: Not on file  . Highest education level: Not on file  Occupational History  . Not on file  Social Needs  . Financial resource  strain: Not on file  . Food insecurity:    Worry: Not on file    Inability: Not on file  . Transportation needs:    Medical: Not on file    Non-medical: Not on file  Tobacco Use  . Smoking status: Never Smoker  . Smokeless tobacco: Never Used  Substance and Sexual Activity  . Alcohol use: No  . Drug use: No  . Sexual activity: Not on file  Lifestyle  . Physical activity:    Days per week: Not on file    Minutes per session: Not on file  . Stress: Not on file  Relationships  . Social connections:    Talks on phone: Not on file    Gets together: Not on file    Attends religious service: Not on file    Active member of club or organization: Not on file    Attends meetings of clubs or organizations: Not on file    Relationship status: Not on file  . Intimate partner violence:    Fear of current or ex partner: Not on file    Emotionally abused: Not on file    Physically abused: Not on file    Forced sexual activity: Not on file  Other Topics Concern  . Not on file  Social History Narrative  . Not on file     Review of Systems  Genitourinary: Negative for decreased urine volume,  difficulty urinating, dysuria, frequency, hematuria, scrotal swelling, testicular pain and urgency.      Objective:   Physical Exam  Constitutional: He is oriented to person, place, and time. He appears well-developed and well-nourished. No distress.  HENT:  Head: Normocephalic and atraumatic.  Mouth/Throat: Oropharynx is clear and moist. No oropharyngeal exudate (no vesicles. ).  Cardiovascular: Normal rate.  Pulmonary/Chest: Effort normal.  Genitourinary: Penis normal.  Genitourinary Comments: There was erythema patch in is inguinal fold bilaterally, worst on left side with slight erythema of the scrotum without swelling. And satellite lesions on proximal thighs.  Musculoskeletal: Normal range of motion.  Neurological: He is alert and oriented to person, place, and time.  Skin:  Healing,  and crusted blisters on palms of hands and feet. Plantar aspect, no proximal rash.   Psychiatric: He has a normal mood and affect. His behavior is normal. Judgment and thought content normal.   Vitals:   06/11/18 1142  BP: 138/84  Pulse: 76  Temp: 98.7 F (37.1 C)  TempSrc: Oral  SpO2: 99%  Weight: 204 lb 6.4 oz (92.7 kg)  Height: 5\' 11"  (1.803 m)       Assessment & Plan:   Keith Hamilton is a 32 y.o. male Rash of groin - Plan: clotrimazole-betamethasone (LOTRISONE) cream  -Previous suspected hand-foot-and-mouth infection is improving, genital rash appears to be tinea cruris, possible yeast based on satellite lesions versus healing vesicular lesions from prior infection.  No discharge, no known risk factors for STIs, no chancres.   -Lotrisone cream topical twice daily until resolved, handout given on tinea cruris, RTC precautions given  Meds ordered this encounter  Medications  . clotrimazole-betamethasone (LOTRISONE) cream    Sig: Apply 1 application topically 2 (two) times daily.    Dispense:  30 g    Refill:  0   Patient Instructions   Hand-foot-and-mouth rash should continue to improve.  If any new rashes or especially if any ulcerations, return for other testing.  Rash in groin may have been from initial viral infection, but currently appears to be a yeast or fungal infection.  Apply the prescription steroid and antifungal cream twice per day, see other information below.  If that rash is not improving within the next week, or worsening sooner, please return for recheck.  Return to the clinic or go to the nearest emergency room if any of your symptoms worsen or new symptoms occur.  Jock Itch Jock itch (tinea cruris) is a fungal infection of the skin in the groin area. It is sometimes called ringworm, even though it is not caused by worms. It is caused by a fungus, which is a type of germ that thrives in dark, damp places. Jock itch causes a rash and itching in the groin  and upper thigh area. It usually goes away in 2-3 weeks with treatment. What are the causes? The fungus that causes jock itch may be spread by:  Touching a fungus infection elsewhere on your body-such as athlete's foot-and then touching your groin area.  Sharing towels or clothing with an infected person.  What increases the risk? Jock itch is most common in men and adolescent boys. This condition is more likely to develop from:  Being in hot, humid climates.  Wearing tight-fitting clothing or wet bathing suits for long periods of time.  Participating in sports.  Being overweight.  Having diabetes.  What are the signs or symptoms? Symptoms of jock itch may include:  A red, pink, or brown  rash in the groin area. The rash may spread to the thighs, anus, and buttocks.  Dry and scaly skin on or around the rash.  Itchiness.  How is this diagnosed? Most often, a health care provider can make the diagnosis by looking at your rash. Sometimes, a scraping of the infected skin will be taken. This sample may be tested by looking at it under a microscope or by trying to grow the fungus from the sample (culture). How is this treated? Treatment for this condition may include:  Antifungal medicine to kill the fungus. This may be in various forms: ? Skin cream or ointment. ? Medicine taken by mouth.  Skin cream or ointment to reduce the itching.  Compresses or medicated powders to dry the infected skin.  Follow these instructions at home:  Take medicines only as directed by your health care provider. Apply skin creams or ointments exactly as directed.  Wear loose-fitting clothing. ? Men should wear cotton boxer shorts. ? Women should wear cotton underwear.  Change your underwear every day to keep your groin dry.  Avoid hot baths.  Dry your groin area well after bathing. ? Use a separate towel to dry your groin area. This will help to prevent a spreading of the infection to  other areas of your body.  Do not scratch the affected area.  Do not share towels with other people. Contact a health care provider if:  Your rash does not improve or it gets worse after 2 weeks of treatment.  Your rash is spreading.  Your rash returns after treatment is finished.  You have a fever.  You have redness, swelling, or pain in the area around your rash.  You have fluid, blood, or pus coming from your rash.  Your have your rash for more than 4 weeks. This information is not intended to replace advice given to you by your health care provider. Make sure you discuss any questions you have with your health care provider. Document Released: 10/21/2002 Document Revised: 04/07/2016 Document Reviewed: 08/12/2014 Elsevier Interactive Patient Education  2018 [employer]    IF you received an x-ray today, you will receive an invoice from [facility]. Please contact [facility] at [phone] with questions or concerns regarding your invoice.   IF you received labwork today, you will receive an invoice from [facility]. Please contact [facility] at [phone] with questions or concerns regarding your invoice.   Our billing staff will not be able to assist you with questions regarding bills from these companies.  You will be contacted with the lab results as soon as they are available. The fastest way to get your results is to activate your My Chart account. Instructions are located on the last page of this paperwork. If you have not heard from us regarding the results in 2 weeks, please contact this office.       I personally performed the services described in this documentation, which was scribed in my presence. The recorded information has been reviewed and considered for accuracy and completeness, addended by me as needed, and agree with information above.  Signed,   Meredith StaggersJeffrey Girolamo Lortie, MD Primary [facility] at [facility].   06/11/18 1:19 PM

## 2018-06-11 NOTE — Patient Instructions (Addendum)
Hand-foot-and-mouth rash should continue to improve.  If any new rashes or especially if any ulcerations, return for other testing.  Rash in groin may have been from initial viral infection, but currently appears to be a yeast or fungal infection.  Apply the prescription steroid and antifungal cream twice per day, see other information below.  If that rash is not improving within the next week, or worsening sooner, please return for recheck.  Return to the clinic or go to the nearest emergency room if any of your symptoms worsen or new symptoms occur.  Jock Itch Jock itch (tinea cruris) is a fungal infection of the skin in the groin area. It is sometimes called ringworm, even though it is not caused by worms. It is caused by a fungus, which is a type of germ that thrives in dark, damp places. Jock itch causes a rash and itching in the groin and upper thigh area. It usually goes away in 2-3 weeks with treatment. What are the causes? The fungus that causes jock itch may be spread by:  Touching a fungus infection elsewhere on your body-such as athlete's foot-and then touching your groin area.  Sharing towels or clothing with an infected person.  What increases the risk? Jock itch is most common in men and adolescent boys. This condition is more likely to develop from:  Being in hot, humid climates.  Wearing tight-fitting clothing or wet bathing suits for long periods of time.  Participating in sports.  Being overweight.  Having diabetes.  What are the signs or symptoms? Symptoms of jock itch may include:  A red, pink, or brown rash in the groin area. The rash may spread to the thighs, anus, and buttocks.  Dry and scaly skin on or around the rash.  Itchiness.  How is this diagnosed? Most often, a health care provider can make the diagnosis by looking at your rash. Sometimes, a scraping of the infected skin will be taken. This sample may be tested by looking at it under a microscope  or by trying to grow the fungus from the sample (culture). How is this treated? Treatment for this condition may include:  Antifungal medicine to kill the fungus. This may be in various forms: ? Skin cream or ointment. ? Medicine taken by mouth.  Skin cream or ointment to reduce the itching.  Compresses or medicated powders to dry the infected skin.  Follow these instructions at home:  Take medicines only as directed by your health care provider. Apply skin creams or ointments exactly as directed.  Wear loose-fitting clothing. ? Men should wear cotton boxer shorts. ? Women should wear cotton underwear.  Change your underwear every day to keep your groin dry.  Avoid hot baths.  Dry your groin area well after bathing. ? Use a separate towel to dry your groin area. This will help to prevent a spreading of the infection to other areas of your body.  Do not scratch the affected area.  Do not share towels with other people. Contact a health care provider if:  Your rash does not improve or it gets worse after 2 weeks of treatment.  Your rash is spreading.  Your rash returns after treatment is finished.  You have a fever.  You have redness, swelling, or pain in the area around your rash.  You have fluid, blood, or pus coming from your rash.  Your have your rash for more than 4 weeks. This information is not intended to replace advice given  to you by your health care provider. Make sure you discuss any questions you have with your health care provider. Document Released: 10/21/2002 Document Revised: 04/07/2016 Document Reviewed: 08/12/2014 Elsevier Interactive Patient Education  2018 [employer].    IF you received an x-ray today, you will receive an invoice from [facility]. Please contact [facility] : [facility] at [phone] with questions or concerns regarding your invoice.   IF you received labwork today, you will receive an invoice from [place]. Please  contact [facility] at [phone] with questions or concerns regarding your invoice.   Our billing staff will not be able to assist you with questions regarding bills from these companies.  You will be contacted with the lab results as soon as they are available. The fastest way to get your results is to activate your My Chart account. Instructions are located on the last page of this paperwork. If you have not heard from [place] regarding the results in 2 weeks, please contact this office.

## 2018-10-09 ENCOUNTER — Ambulatory Visit (INDEPENDENT_AMBULATORY_CARE_PROVIDER_SITE_OTHER): Payer: Managed Care, Other (non HMO) | Admitting: Family Medicine

## 2018-10-09 ENCOUNTER — Encounter: Payer: Self-pay | Admitting: Family Medicine

## 2018-10-09 ENCOUNTER — Other Ambulatory Visit: Payer: Self-pay

## 2018-10-09 VITALS — BP 118/72 | HR 82 | Temp 99.1°F | Resp 16 | Ht 71.5 in | Wt 212.0 lb

## 2018-10-09 DIAGNOSIS — J0100 Acute maxillary sinusitis, unspecified: Secondary | ICD-10-CM | POA: Diagnosis not present

## 2018-10-09 DIAGNOSIS — Z23 Encounter for immunization: Secondary | ICD-10-CM | POA: Diagnosis not present

## 2018-10-09 MED ORDER — AMOXICILLIN-POT CLAVULANATE 875-125 MG PO TABS: 1.0000 | ORAL_TABLET | Freq: Two times a day (BID) | ORAL | 0 refills | Status: DC

## 2018-10-09 MED ORDER — FLUTICASONE PROPIONATE 50 MCG/ACT NA SUSP: 1.0000 | Freq: Two times a day (BID) | NASAL | 6 refills | Status: DC

## 2018-10-09 NOTE — Patient Instructions (Addendum)
   If you have lab work done today you will be contacted with your lab results within the next 2 weeks.  If you have not heard from us then please contact us. The fastest way to get your results is to register for My Chart.   IF you received an x-ray today, you will receive an invoice from [facility]. Please contact [facility] at [phone] with questions or concerns regarding your invoice.   IF you received labwork today, you will receive an invoice from [facility]. Please contact [facility] at [phone] with questions or concerns regarding your invoice.   Our billing staff will not be able to assist you with questions regarding bills from these companies.  You will be contacted with the lab results as soon as they are available. The fastest way to get your results is to activate your My Chart account. Instructions are located on the last page of this paperwork. If you have not heard from us regarding the results in 2 weeks, please contact this office.     Sinusitis, Adult Sinusitis is soreness and inflammation of your sinuses. Sinuses are hollow spaces in the bones around your face. They are located:  Around your eyes.  In the middle of your forehead.  Behind your nose.  In your cheekbones.  Your sinuses and nasal passages are lined with a stringy fluid (mucus). Mucus normally drains out of your sinuses. When your nasal tissues get inflamed or swollen, the mucus can get trapped or blocked so air cannot flow through your sinuses. This lets bacteria, viruses, and funguses grow, and that leads to infection. Follow these instructions at home: Medicines  Take, use, or apply over-the-counter and prescription medicines only as told by your doctor. These may include nasal sprays.  If you were prescribed an antibiotic medicine, take it as told by your doctor. Do not stop taking the antibiotic even if you start to feel better. Hydrate and Humidify  Drink enough  water to keep your pee (urine) clear or pale yellow.  Use a cool mist humidifier to keep the humidity level in your home above 50%.  Breathe in steam for 10-15 minutes, 3-4 times a day or as told by your doctor. You can do this in the bathroom while a hot shower is running.  Try not to spend time in cool or dry air. Rest  Rest as much as possible.  Sleep with your head raised (elevated).  Make sure to get enough sleep each night. General instructions  Put a warm, moist washcloth on your face 3-4 times a day or as told by your doctor. This will help with discomfort.  Wash your hands often with soap and water. If there is no soap and water, use hand sanitizer.  Do not smoke. Avoid being around people who are smoking (secondhand smoke).  Keep all follow-up visits as told by your doctor. This is important. Contact a doctor if:  You have a fever.  Your symptoms get worse.  Your symptoms do not get better within 10 days. Get help right away if:  You have a very bad headache.  You cannot stop throwing up (vomiting).  You have pain or swelling around your face or eyes.  You have trouble seeing.  You feel confused.  Your neck is stiff.  You have trouble breathing. This information is not intended to replace advice given to you by your health care provider. Make sure you discuss any questions you have with your health   care provider. Document Released: 04/18/2008 Document Revised: 06/26/2016 Document Reviewed: 08/26/2015 Elsevier Interactive Patient Education  2018 Elsevier Inc.  

## 2018-10-09 NOTE — Progress Notes (Signed)
   11/26/201911:12 AM  Jabier MuttonJonathan D Mawhinney Aug 18, 1986, 32 y.o. male [id number]  Chief Complaint  Patient presents with  . Cough    x 8 days,   . Nasal congestion    x 1 wk, "alot of sinus pressure right side of face, from forehead to jaw."    HPI:   Patient is a 32 y.o. male who presents today for sinus infection  > week of nasal congestion, thick green drainage For past 2 days having worsening right sided facial pressure No allery sx Some cough, getting better Taking mucinex and OTC cold meds Does not smoke Denies any h/o sinus surgery Denies any recent abx Denies any fever or chills  Fall Risk  10/09/2018 06/11/2018 12/22/2017 08/25/2017 06/19/2017  Falls in the past year? 0 No No No No     [facility] 2/9 10/09/2018 06/11/2018 12/22/2017  Decreased Interest 0 0 0  Down, Depressed, Hopeless 0 0 0  PHQ - 2 Score 0 0 0    No Known Allergies  Prior to Admission medications   Medication Sig Start Date End Date Taking? Authorizing Provider    No past medical history on file.  No past surgical history on file.  Social History   Tobacco Use  . Smoking status: Never Smoker  . Smokeless tobacco: Never Used  Substance Use Topics  . Alcohol use: No    No family history on file.  ROS Per hpi  OBJECTIVE:  Blood pressure 118/72, pulse 82, temperature 99.1 F (37.3 C), temperature source Oral, resp. rate 16, height 5' 11.5" (1.816 m), weight 212 lb (96.2 kg), SpO2 99 %. Body mass index is 29.16 kg/m.   Physical Exam  Constitutional: He is oriented to person, place, and time. He appears well-developed and well-nourished.  HENT:  Head: Normocephalic and atraumatic.  Right Ear: Hearing, tympanic membrane, external ear and ear canal normal.  Left Ear: Hearing, tympanic membrane, external ear and ear canal normal.  Mouth/Throat: Oropharynx is clear and moist. No oropharyngeal exudate.  Eyes: Pupils are equal, round, and reactive to light. Conjunctivae and EOM  are normal.  Neck: Neck supple.  Cardiovascular: Normal rate and regular rhythm. Exam reveals no gallop and no friction rub.  No murmur heard. Pulmonary/Chest: Effort normal and breath sounds normal. He has no wheezes. He has no rales.  Musculoskeletal: He exhibits no edema.  Lymphadenopathy:    He has no cervical adenopathy.  Neurological: He is alert and oriented to person, place, and time.  Skin: Skin is warm and dry.  Psychiatric: He has a normal mood and affect.  Nursing note and vitals reviewed.    ASSESSMENT and PLAN  1. Acute non-recurrent maxillary sinusitis Discussed supportive measures, new meds r/se/b and RTC precautions. Patient educational handout given.  Other orders - Flu Vaccine QUAD 6+ mos PF IM (Fluarix Quad PF) - amoxicillin-clavulanate (AUGMENTIN) 875-125 MG tablet; Take 1 tablet by mouth 2 (two) times daily. - fluticasone (FLONASE) 50 MCG/ACT nasal spray; Place 1 spray into both nostrils 2 (two) times daily.  Return if symptoms worsen or fail to improve.    Myles LippsIrma M Santiago, MD Primary Care at [facility] [address] [place], [place] [address] Ph.  [phone] Fax [phone]

## 2020-03-17 ENCOUNTER — Other Ambulatory Visit: Payer: Self-pay

## 2020-03-17 ENCOUNTER — Ambulatory Visit (INDEPENDENT_AMBULATORY_CARE_PROVIDER_SITE_OTHER): Payer: Managed Care, Other (non HMO)

## 2020-03-17 ENCOUNTER — Ambulatory Visit (INDEPENDENT_AMBULATORY_CARE_PROVIDER_SITE_OTHER): Payer: Managed Care, Other (non HMO) | Admitting: Emergency Medicine

## 2020-03-17 ENCOUNTER — Encounter: Payer: Self-pay | Admitting: Emergency Medicine

## 2020-03-17 VITALS — BP 121/79 | HR 82 | Temp 98.5°F | Resp 16 | Ht 71.0 in | Wt 217.0 lb

## 2020-03-17 DIAGNOSIS — R413 Other amnesia: Secondary | ICD-10-CM | POA: Diagnosis not present

## 2020-03-17 DIAGNOSIS — R41840 Attention and concentration deficit: Secondary | ICD-10-CM | POA: Diagnosis not present

## 2020-03-17 DIAGNOSIS — R590 Localized enlarged lymph nodes: Secondary | ICD-10-CM

## 2020-03-17 NOTE — Progress Notes (Addendum)
Keith Hamilton 34 y.o.   Chief Complaint  Patient presents with  . Memory Loss    per pt he forget things sometimes - per pt mother was babysitting his daughter and something bad happened he did not tell wife  . Mass    above Right collar bone for 2 months, pt denies pain    HISTORY OF PRESENT ILLNESS: This is a 34 y.o. male complaining of attention and memory problems that started about 2 months ago.  Having problems "staying attentive" and "mind wonders off".  Married with 2 kids.  Stress at work. Non-smoker and no EtOH abuser.  No history of chronic medical problems.  No chronic medications. No other significant associated symptoms. Also complaining of right supraclavicular lymph node for the past 2 months.  No pain.  HPI   Prior to Admission medications   Medication Sig Start Date End Date Taking? Authorizing Provider  fluticasone (FLONASE) 50 MCG/ACT nasal spray Place 1 spray into both nostrils 2 (two) times daily. Patient not taking: Reported on 03/17/2020 10/09/18   Myles Lipps, MD    No Known Allergies  There are no problems to display for this patient.   History reviewed. No pertinent past medical history.  History reviewed. No pertinent surgical history.  Social History   Socioeconomic History  . Marital status: Married    Spouse name: Not on file  . Number of children: Not on file  . Years of education: Not on file  . Highest education level: Not on file  Occupational History  . Not on file  Tobacco Use  . Smoking status: Never Smoker  . Smokeless tobacco: Never Used  Substance and Sexual Activity  . Alcohol use: No  . Drug use: No  . Sexual activity: Not on file  Other Topics Concern  . Not on file  Social History Narrative  . Not on file   Social Determinants of Health   Financial Resource Strain:   . Difficulty of Paying Living Expenses:   Food Insecurity:   . Worried About Programme researcher, broadcasting/film/video in the Last Year:   . Barista in  the Last Year:   Transportation Needs:   . Freight forwarder (Medical):   Marland Kitchen Lack of Transportation (Non-Medical):   Physical Activity:   . Days of Exercise per Week:   . Minutes of Exercise per Session:   Stress:   . Feeling of Stress :   Social Connections:   . Frequency of Communication with Friends and Family:   . Frequency of Social Gatherings with Friends and Family:   . Attends Religious Services:   . Active Member of Clubs or Organizations:   . Attends Banker Meetings:   Marland Kitchen Marital Status:   Intimate Partner Violence:   . Fear of Current or Ex-Partner:   . Emotionally Abused:   Marland Kitchen Physically Abused:   . Sexually Abused:     History reviewed. No pertinent family history.   Review of Systems  Constitutional: Negative.  Negative for chills and fever.  HENT: Negative.  Negative for congestion and sore throat.   Eyes: Negative.  Negative for blurred vision and double vision.  Respiratory: Negative.  Negative for cough and shortness of breath.   Cardiovascular: Negative.  Negative for chest pain and palpitations.  Gastrointestinal: Negative for abdominal pain, blood in stool, diarrhea, heartburn, melena, nausea and vomiting.  Genitourinary: Negative.  Negative for dysuria and hematuria.  Musculoskeletal: Negative.  Negative  for back pain, myalgias and neck pain.  Skin: Negative.  Negative for rash.  Neurological: Negative.  Negative for dizziness and headaches.  All other systems reviewed and are negative.  Today's Vitals   03/17/20 1050  BP: 121/79  Pulse: 82  Resp: 16  Temp: 98.5 F (36.9 C)  TempSrc: Temporal  SpO2: 98%  Weight: 217 lb (98.4 kg)  Height: 5\' 11"  (1.803 m)   Body mass index is 30.27 kg/m.   Physical Exam Vitals reviewed.  Constitutional:      Appearance: Normal appearance.  HENT:     Head: Normocephalic.     Mouth/Throat:     Mouth: Mucous membranes are moist.     Pharynx: Oropharynx is clear.  Eyes:     Extraocular  Movements: Extraocular movements intact.     Conjunctiva/sclera: Conjunctivae normal.     Pupils: Pupils are equal, round, and reactive to light.  Cardiovascular:     Rate and Rhythm: Normal rate and regular rhythm.     Pulses: Normal pulses.     Heart sounds: Normal heart sounds.  Pulmonary:     Effort: Pulmonary effort is normal.     Breath sounds: Normal breath sounds.  Abdominal:     General: There is no distension.     Palpations: Abdomen is soft.  Musculoskeletal:        General: Normal range of motion.     Cervical back: Normal range of motion and neck supple.  Lymphadenopathy:     Cervical: No cervical adenopathy.     Upper Body:     Right upper body: Supraclavicular adenopathy (Small, nontender, mobile) present.  Skin:    General: Skin is warm and dry.  Neurological:     General: No focal deficit present.     Mental Status: He is alert and oriented to person, place, and time.     Cranial Nerves: No cranial nerve deficit.     Sensory: No sensory deficit.     Motor: No weakness.     Coordination: Coordination normal.     Gait: Gait normal.  Psychiatric:        Mood and Affect: Mood normal.        Behavior: Behavior normal.    DG Chest 2 View  Result Date: 03/17/2020 CLINICAL DATA:  Right-sided supraclavicular lymph node. EXAM: CHEST - 2 VIEW COMPARISON:  06/19/2017 FINDINGS: Lungs are adequately inflated and otherwise clear. Cardiomediastinal silhouette, bones and soft tissues are normal. IMPRESSION: No active cardiopulmonary disease. Electronically Signed   By: 08/19/2017 M.D.   On: 03/17/2020 11:57     ASSESSMENT & PLAN: [name] was seen today for memory loss and mass.  Diagnoses and all orders for this visit:  Attention deficit -     TSH -     Comprehensive metabolic panel -     CBC with Differential/Platelet -     Hemoglobin A1c -     Ambulatory referral to Neurology  Supraclavicular adenopathy -     DG Chest 2 View -     TSH -     Comprehensive  metabolic panel -     CBC with Differential/Platelet -     Hemoglobin A1c  Memory problem -     Ambulatory referral to Neurology    Patient Instructions       If you have lab work done today you will be contacted with your lab results within the next 2 weeks.  If you have not heard  from [place] then please contact us. The fastest way to get your results is to register for My Chart.   IF you received an x-ray today, you will receive an invoice from [facility]. Please contact [facility] at [phone] with questions or concerns regarding your invoice.   IF you received labwork today, you will receive an invoice from [facility]. Please contact [facility] at [phone] with questions or concerns regarding your invoice.   Our billing staff will not be able to assist you with questions regarding bills from these companies.  You will be contacted with the lab results as soon as they are available. The fastest way to get your results is to activate your My Chart account. Instructions are located on the last page of this paperwork. If you have not heard from [place] regarding the results in 2 weeks, please contact this office.     Health Maintenance, Male Adopting a healthy lifestyle and getting preventive care are important in promoting health and wellness. Ask your health care provider about:  The right schedule for you to have regular tests and exams.  Things you can do on your own to prevent diseases and keep yourself healthy. What should I know about diet, weight, and exercise? Eat a healthy diet   Eat a diet that includes plenty of vegetables, fruits, low-fat dairy products, and lean protein.  Do not eat a lot of foods that are high in solid fats, added sugars, or sodium. Maintain a healthy weight Body mass index (BMI) is a measurement that can be used to identify possible weight problems. It estimates body fat based on height and weight. Your health care provider can  help determine your BMI and help you achieve or maintain a healthy weight. Get regular exercise Get regular exercise. This is one of the most important things you can do for your health. Most adults should:  Exercise for at least 150 minutes each week. The exercise should increase your heart rate and make you sweat (moderate-intensity exercise).  Do strengthening exercises at least twice a week. This is in addition to the moderate-intensity exercise.  Spend less time sitting. Even light physical activity can be beneficial. Watch cholesterol and blood lipids Have your blood tested for lipids and cholesterol at 36 years of age, then have this test every 5 years. You may need to have your cholesterol levels checked more often if:  Your lipid or cholesterol levels are high.  You are older than 36 years of age.  You are at high risk for heart disease. What should I know about cancer screening? Many types of cancers can be detected early and may often be prevented. Depending on your health history and family history, you may need to have cancer screening at various ages. This may include screening for:  Colorectal cancer.  Prostate cancer.  Skin cancer.  Lung cancer. What should I know about heart disease, diabetes, and high blood pressure? Blood pressure and heart disease  High blood pressure causes heart disease and increases the risk of stroke. This is more likely to develop in people who have high blood pressure readings, are of African descent, or are overweight.  Talk with your health care provider about your target blood pressure readings.  Have your blood pressure checked: ? Every 3-5 years if you are 59-68 years of age. ? Every year if you are 61 years old or older.  If you are between the ages of 72 and 20 and are a current or  former smoker, ask your health care provider if you should have a one-time screening for abdominal aortic aneurysm (AAA). Diabetes Have regular  diabetes screenings. This checks your fasting blood sugar level. Have the screening done:  Once every three years after age 74 if you are at a normal weight and have a low risk for diabetes.  More often and at a younger age if you are overweight or have a high risk for diabetes. What should I know about preventing infection? Hepatitis B If you have a higher risk for hepatitis B, you should be screened for this virus. Talk with your health care provider to find out if you are at risk for hepatitis B infection. Hepatitis C Blood testing is recommended for:  Everyone born from [birth year] through 1967.  Anyone with known risk factors for hepatitis C. Sexually transmitted infections (STIs)  You should be screened each year for STIs, including gonorrhea and chlamydia, if: ? You are sexually active and are younger than 36 years of age. ? You are older than 36 years of age and your health care provider tells you that you are at risk for this type of infection. ? Your sexual activity has changed since you were last screened, and you are at increased risk for chlamydia or gonorrhea. Ask your health care provider if you are at risk.  Ask your health care provider about whether you are at high risk for HIV. Your health care provider may recommend a prescription medicine to help prevent HIV infection. If you choose to take medicine to prevent HIV, you should first get tested for HIV. You should then be tested every 3 months for as long as you are taking the medicine. Follow these instructions at home: Lifestyle  Do not use any products that contain nicotine or tobacco, such as cigarettes, e-cigarettes, and chewing tobacco. If you need help quitting, ask your health care provider.  Do not use street drugs.  Do not share needles.  Ask your health care provider for help if you need support or information about quitting drugs. Alcohol use  Do not drink alcohol if your health care provider tells you not to  drink.  If you drink alcohol: ? Limit how much you have to 0-2 drinks a day. ? Be aware of how much alcohol is in your drink. In the [place] one drink equals one 12 oz bottle of beer (355 mL), one 5 oz glass of wine (148 mL), or one 1 oz glass of hard liquor (44 mL). General instructions  Schedule regular health, dental, and eye exams.  Stay current with your vaccines.  Tell your health care provider if: ? You often feel depressed. ? You have ever been abused or do not feel safe at home. Summary  Adopting a healthy lifestyle and getting preventive care are important in promoting health and wellness.  Follow your health care provider's instructions about healthy diet, exercising, and getting tested or screened for diseases.  Follow your health care provider's instructions on monitoring your cholesterol and blood pressure. This information is not intended to replace advice given to you by your health care provider. Make sure you discuss any questions you have with your health care provider. Document Revised: 10/24/2018 Document Reviewed: 10/24/2018 Elsevier Patient Education  2020 [employer] Inc.      Edwina Barth, MD [facility]

## 2020-03-17 NOTE — Patient Instructions (Addendum)
   If you have lab work done today you will be contacted with your lab results within the next 2 weeks.  If you have not heard from us then please contact us. The fastest way to get your results is to register for My Chart.   IF you received an x-ray today, you will receive an invoice from [facility]. Please contact [facility] at [phone] with questions or concerns regarding your invoice.   IF you received labwork today, you will receive an invoice from [facility]. Please contact [facility] at [phone] with questions or concerns regarding your invoice.   Our billing staff will not be able to assist you with questions regarding bills from these companies.  You will be contacted with the lab results as soon as they are available. The fastest way to get your results is to activate your My Chart account. Instructions are located on the last page of this paperwork. If you have not heard from us regarding the results in 2 weeks, please contact this office.      Health Maintenance, Male Adopting a healthy lifestyle and getting preventive care are important in promoting health and wellness. Ask your health care provider about:  The right schedule for you to have regular tests and exams.  Things you can do on your own to prevent diseases and keep yourself healthy. What should I know about diet, weight, and exercise? Eat a healthy diet   Eat a diet that includes plenty of vegetables, fruits, low-fat dairy products, and lean protein.  Do not eat a lot of foods that are high in solid fats, added sugars, or sodium. Maintain a healthy weight Body mass index (BMI) is a measurement that can be used to identify possible weight problems. It estimates body fat based on height and weight. Your health care provider can help determine your BMI and help you achieve or maintain a healthy weight. Get regular exercise Get regular exercise. This is one of the most important things you  can do for your health. Most adults should:  Exercise for at least 150 minutes each week. The exercise should increase your heart rate and make you sweat (moderate-intensity exercise).  Do strengthening exercises at least twice a week. This is in addition to the moderate-intensity exercise.  Spend less time sitting. Even light physical activity can be beneficial. Watch cholesterol and blood lipids Have your blood tested for lipids and cholesterol at 36 years of age, then have this test every 5 years. You may need to have your cholesterol levels checked more often if:  Your lipid or cholesterol levels are high.  You are older than 36 years of age.  You are at high risk for heart disease. What should I know about cancer screening? Many types of cancers can be detected early and may often be prevented. Depending on your health history and family history, you may need to have cancer screening at various ages. This may include screening for:  Colorectal cancer.  Prostate cancer.  Skin cancer.  Lung cancer. What should I know about heart disease, diabetes, and high blood pressure? Blood pressure and heart disease  High blood pressure causes heart disease and increases the risk of stroke. This is more likely to develop in people who have high blood pressure readings, are of African descent, or are overweight.  Talk with your health care provider about your target blood pressure readings.  Have your blood pressure checked: ? Every 3-5 years if you are 18-39   years of age. ? Every year if you are 40 years old or older.  If you are between the ages of 65 and 75 and are a current or former smoker, ask your health care provider if you should have a one-time screening for abdominal aortic aneurysm (AAA). Diabetes Have regular diabetes screenings. This checks your fasting blood sugar level. Have the screening done:  Once every three years after age 45 if you are at a normal weight and have  a low risk for diabetes.  More often and at a younger age if you are overweight or have a high risk for diabetes. What should I know about preventing infection? Hepatitis B If you have a higher risk for hepatitis B, you should be screened for this virus. Talk with your health care provider to find out if you are at risk for hepatitis B infection. Hepatitis C Blood testing is recommended for:  Everyone born from 1945 through 1965.  Anyone with known risk factors for hepatitis C. Sexually transmitted infections (STIs)  You should be screened each year for STIs, including gonorrhea and chlamydia, if: ? You are sexually active and are younger than 36 years of age. ? You are older than 36 years of age and your health care provider tells you that you are at risk for this type of infection. ? Your sexual activity has changed since you were last screened, and you are at increased risk for chlamydia or gonorrhea. Ask your health care provider if you are at risk.  Ask your health care provider about whether you are at high risk for HIV. Your health care provider may recommend a prescription medicine to help prevent HIV infection. If you choose to take medicine to prevent HIV, you should first get tested for HIV. You should then be tested every 3 months for as long as you are taking the medicine. Follow these instructions at home: Lifestyle  Do not use any products that contain nicotine or tobacco, such as cigarettes, e-cigarettes, and chewing tobacco. If you need help quitting, ask your health care provider.  Do not use street drugs.  Do not share needles.  Ask your health care provider for help if you need support or information about quitting drugs. Alcohol use  Do not drink alcohol if your health care provider tells you not to drink.  If you drink alcohol: ? Limit how much you have to 0-2 drinks a day. ? Be aware of how much alcohol is in your drink. In the [place] one drink equals one 12  oz bottle of beer (355 mL), one 5 oz glass of wine (148 mL), or one 1 oz glass of hard liquor (44 mL). General instructions  Schedule regular health, dental, and eye exams.  Stay current with your vaccines.  Tell your health care provider if: ? You often feel depressed. ? You have ever been abused or do not feel safe at home. Summary  Adopting a healthy lifestyle and getting preventive care are important in promoting health and wellness.  Follow your health care provider's instructions about healthy diet, exercising, and getting tested or screened for diseases.  Follow your health care provider's instructions on monitoring your cholesterol and blood pressure. This information is not intended to replace advice given to you by your health care provider. Make sure you discuss any questions you have with your health care provider. Document Revised: 10/24/2018 Document Reviewed: 10/24/2018 Elsevier Patient Education  2020 [employer].  

## 2020-03-18 ENCOUNTER — Telehealth: Payer: Self-pay

## 2020-03-18 LAB — COMPREHENSIVE METABOLIC PANEL
ALT: 34 IU/L (ref 0–44)
AST: 65 IU/L — ABNORMAL HIGH (ref 0–40)
Albumin/Globulin Ratio: 1.5 (ref 1.2–2.2)
Albumin: 4.9 g/dL (ref 4.0–5.0)
Alkaline Phosphatase: 103 IU/L (ref 39–117)
BUN/Creatinine Ratio: 12 (ref 9–20)
BUN: 15 mg/dL (ref 6–20)
Bilirubin Total: 0.7 mg/dL (ref 0.0–1.2)
CO2: 22 mmol/L (ref 20–29)
Calcium: 9.8 mg/dL (ref 8.7–10.2)
Chloride: 101 mmol/L (ref 96–106)
Creatinine, Ser: 1.21 mg/dL (ref 0.76–1.27)
GFR calc Af Amer: 89 mL/min/{1.73_m2} (ref >59)
GFR calc non Af Amer: 77 mL/min/{1.73_m2} (ref >59)
Globulin, Total: 3.2 g/dL (ref 1.5–4.5)
Glucose: 88 mg/dL (ref 65–99)
Potassium: 3.9 mmol/L (ref 3.5–5.2)
Sodium: 139 mmol/L (ref 134–144)
Total Protein: 8.1 g/dL (ref 6.0–8.5)

## 2020-03-18 LAB — CBC WITH DIFFERENTIAL/PLATELET
Basophils Absolute: 0.1 10*3/uL (ref 0.0–0.2)
Basos: 1 %
EOS (ABSOLUTE): 0.2 10*3/uL (ref 0.0–0.4)
Eos: 2 %
Hematocrit: 47.2 % (ref 37.5–51.0)
Hemoglobin: 16.2 g/dL (ref 13.0–17.7)
Immature Grans (Abs): 0 10*3/uL (ref 0.0–0.1)
Immature Granulocytes: 0 %
Lymphocytes Absolute: 2.7 10*3/uL (ref 0.7–3.1)
Lymphs: 34 %
MCH: 31.6 pg (ref 26.6–33.0)
MCHC: 34.3 g/dL (ref 31.5–35.7)
MCV: 92 fL (ref 79–97)
Monocytes Absolute: 0.9 10*3/uL (ref 0.1–0.9)
Monocytes: 11 %
Neutrophils Absolute: 4.2 10*3/uL (ref 1.4–7.0)
Neutrophils: 52 %
Platelets: 278 10*3/uL (ref 150–450)
RBC: 5.12 x10E6/uL (ref 4.14–5.80)
RDW: 13.1 % (ref 11.6–15.4)
WBC: 8.1 10*3/uL (ref 3.4–10.8)

## 2020-03-18 LAB — TSH: TSH: 1.14 u[IU]/mL (ref 0.450–4.500)

## 2020-03-18 LAB — HEMOGLOBIN A1C
Est. average glucose Bld gHb Est-mCnc: 111 mg/dL
Hgb A1c MFr Bld: 5.5 % (ref 4.8–5.6)

## 2020-03-18 NOTE — Telephone Encounter (Signed)
Patient has been informed per drs lab result notes , patient verbalized understanding.

## 2020-03-18 NOTE — Telephone Encounter (Signed)
-----   Message from [facility], MD sent at 03/18/2020  8:09 AM EDT ----- Call patient please.  Normal labs.  Thanks.

## 2020-03-20 ENCOUNTER — Telehealth: Payer: Self-pay | Admitting: General Practice

## 2020-03-20 NOTE — Telephone Encounter (Signed)
Patient has some concerns about referral. He states that when their office called it was a for a different reason that you both discussed at the office. Please Advise

## 2020-03-20 NOTE — Telephone Encounter (Signed)
Pt called and is wanting Dr. Ivory Broad to give him a call about his referral. Pt states when the office called him it was for a different reason than he thought and wants to discuss this matter with Dr. Alvy Bimler. [phone] Please advise

## 2020-03-27 NOTE — Telephone Encounter (Signed)
Called pt and sch appt for Monday 03/30/20 via Mychart

## 2020-03-27 NOTE — Telephone Encounter (Signed)
This pt is wanting to talk to his provider regarding a referral for neuro, pls schedule appt for mychart video

## 2020-03-27 NOTE — Telephone Encounter (Signed)
Pt is checking on status of this message. He is stating [facility] wanted to see  him for memory loss. He wants this referral for Attention deficit. Please advise at [phone].

## 2020-03-30 ENCOUNTER — Encounter: Payer: Self-pay | Admitting: Emergency Medicine

## 2020-03-30 ENCOUNTER — Telehealth (INDEPENDENT_AMBULATORY_CARE_PROVIDER_SITE_OTHER): Payer: Managed Care, Other (non HMO) | Admitting: Emergency Medicine

## 2020-03-30 ENCOUNTER — Other Ambulatory Visit: Payer: Self-pay

## 2020-03-30 DIAGNOSIS — R41840 Attention and concentration deficit: Secondary | ICD-10-CM | POA: Diagnosis not present

## 2020-03-30 NOTE — Progress Notes (Signed)
Telemedicine Encounter- SOAP NOTE Established Patient My chart video conference attempted without success. This telephone encounter was conducted with the patient's (or proxy's) verbal consent via audio telecommunications: yes/no: Yes Patient was instructed to have this encounter in a suitably private space; and to only have persons present to whom they give permission to participate. In addition, patient identity was confirmed by use of name plus two identifiers (DOB and address).  I discussed the limitations, risks, security and privacy concerns of performing an evaluation and management service by telephone and the availability of in person appointments. I also discussed with the patient that there may be a patient responsible charge related to this service. The patient expressed understanding and agreed to proceed.  I spent a total of TIME; 0 MIN TO 60 MIN: 15 minutes talking with the patient or their proxy.  Chief Complaint  Patient presents with  . Referral    pt would like to discuss referral as neuro said they do not treat attention defiecit and he does not have memory issues as the referral states would like to discuss another option for his attention deficit issue     Subjective   Keith Hamilton is a 34 y.o. male established patient. Telephone visit today for clarification on referral.  Seen by me on 03/17/2020 when he complained of attention and memory issues.  Was referred to neurology but on questioning it was determined he needed an attention deficit disorder specialist.  Needs a new referral. No other complaints or medical concerns today.  HPI   There are no problems to display for this patient.   No past medical history on file.  No current outpatient medications on file.   No current facility-administered medications for this visit.    No Known Allergies  Social History   Socioeconomic History  . Marital status: Married    Spouse name: Not on file  .  Number of children: Not on file  . Years of education: Not on file  . Highest education level: Not on file  Occupational History  . Not on file  Tobacco Use  . Smoking status: Never Smoker  . Smokeless tobacco: Never Used  Substance and Sexual Activity  . Alcohol use: No  . Drug use: No  . Sexual activity: Not on file  Other Topics Concern  . Not on file  Social History Narrative  . Not on file   Social Determinants of Health   Financial Resource Strain:   . Difficulty of Paying Living Expenses:   Food Insecurity:   . Worried About Programme researcher, broadcasting/film/video in the Last Year:   . Barista in the Last Year:   Transportation Needs:   . Freight forwarder (Medical):   Marland Kitchen Lack of Transportation (Non-Medical):   Physical Activity:   . Days of Exercise per Week:   . Minutes of Exercise per Session:   Stress:   . Feeling of Stress :   Social Connections:   . Frequency of Communication with Friends and Family:   . Frequency of Social Gatherings with Friends and Family:   . Attends Religious Services:   . Active Member of Clubs or Organizations:   . Attends Banker Meetings:   Marland Kitchen Marital Status:   Intimate Partner Violence:   . Fear of Current or Ex-Partner:   . Emotionally Abused:   Marland Kitchen Physically Abused:   . Sexually Abused:     Review of Systems  Constitutional:  Negative.  Negative for chills and fever.  HENT: Negative.  Negative for congestion and sore throat.   Respiratory: Negative.  Negative for cough and shortness of breath.   Cardiovascular: Negative.  Negative for chest pain and palpitations.  Gastrointestinal: Negative.  Negative for abdominal pain, diarrhea, nausea and vomiting.  Genitourinary: Negative.  Negative for dysuria and hematuria.  Musculoskeletal: Negative for back pain, myalgias and neck pain.  Skin: Negative.  Negative for rash.  Neurological: Negative.  Negative for dizziness and headaches.  Endo/Heme/Allergies: Negative.   All  other systems reviewed and are negative.   Objective  Alert and oriented x3 in no apparent respiratory distress. Vitals as reported by the patient: There were no vitals filed for this visit.  There are no diagnoses linked to this encounter.  Keith Hamilton was seen today for referral.  Diagnoses and all orders for this visit:  Attention deficit -     Ambulatory referral to Psychiatry     I discussed the assessment and treatment plan with the patient. The patient was provided an opportunity to ask questions and all were answered. The patient agreed with the plan and demonstrated an understanding of the instructions.   The patient was advised to call back or seek an in-person evaluation if the symptoms worsen or if the condition fails to improve as anticipated.  I provided 15 minutes of non-face-to-face time during this encounter.  Horald Pollen, MD  Primary Care at [facility]

## 2020-09-29 ENCOUNTER — Telehealth (HOSPITAL_COMMUNITY): Payer: Self-pay

## 2020-09-29 ENCOUNTER — Encounter: Payer: Self-pay | Admitting: Adult Health

## 2020-09-29 ENCOUNTER — Ambulatory Visit (HOSPITAL_COMMUNITY)
Admission: RE | Admit: 2020-09-29 | Discharge: 2020-09-29 | Disposition: A | Discharge status: Home / Self Care | Payer: Managed Care, Other (non HMO) | Source: Ambulatory Visit | Attending: Pulmonary Disease | Admitting: Pulmonary Disease

## 2020-09-29 ENCOUNTER — Other Ambulatory Visit: Payer: Self-pay | Admitting: Adult Health

## 2020-09-29 DIAGNOSIS — Z6825 Body mass index (BMI) 25.0-25.9, adult: Secondary | ICD-10-CM | POA: Diagnosis not present

## 2020-09-29 DIAGNOSIS — U071 COVID-19: Secondary | ICD-10-CM | POA: Diagnosis present

## 2020-09-29 MED ORDER — FAMOTIDINE IN NACL 20-0.9 MG/50ML-% IV SOLN: 20.0000 mg | Freq: Once | INTRAVENOUS | Status: DC | PRN

## 2020-09-29 MED ORDER — SOTROVIMAB 500 MG/8ML IV SOLN
500.0000 mg | Freq: Once | INTRAVENOUS | Status: AC
  Administered 2020-09-29: 500 mg via INTRAVENOUS

## 2020-09-29 MED ORDER — SODIUM CHLORIDE 0.9 % IV SOLN: INTRAVENOUS | Status: DC | PRN

## 2020-09-29 MED ORDER — EPINEPHRINE 0.3 MG/0.3ML IJ SOAJ: 0.3000 mg | Freq: Once | INTRAMUSCULAR | Status: DC | PRN

## 2020-09-29 MED ORDER — METHYLPREDNISOLONE SODIUM SUCC 125 MG IJ SOLR: 125.0000 mg | Freq: Once | INTRAMUSCULAR | Status: DC | PRN

## 2020-09-29 MED ORDER — DIPHENHYDRAMINE HCL 50 MG/ML IJ SOLN: 50.0000 mg | Freq: Once | INTRAMUSCULAR | Status: DC | PRN

## 2020-09-29 MED ORDER — ALBUTEROL SULFATE HFA 108 (90 BASE) MCG/ACT IN AERS: 2.0000 | INHALATION_SPRAY | Freq: Once | RESPIRATORY_TRACT | Status: DC | PRN

## 2020-09-29 NOTE — Progress Notes (Signed)
I connected by phone with Keith Hamilton on 09/29/2020 at 11:56 AM to discuss the potential use of a new treatment for mild to moderate COVID-19 viral infection in non-hospitalized patients.  This patient is a 34 y.o. male that meets the FDA criteria for Emergency Use Authorization of COVID monoclonal antibody casirivimab/imdevimab, bamlanivimab/eteseviamb, or sotrovimab.  Has a (+) direct SARS-CoV-2 viral test result  Has mild or moderate COVID-19   Is NOT hospitalized due to COVID-19  Is within 10 days of symptom onset  Has at least one of the high risk factor(s) for progression to severe COVID-19 and/or hospitalization as defined in EUA.  Specific high risk criteria : BMI > 25   I have spoken and communicated the following to the patient or parent/caregiver regarding COVID monoclonal antibody treatment:  1. FDA has authorized the emergency use for the treatment of mild to moderate COVID-19 in adults and pediatric patients with positive results of direct SARS-CoV-2 viral testing who are 66 years of age and older weighing at least 40 kg, and who are at high risk for progressing to severe COVID-19 and/or hospitalization.  2. The significant known and potential risks and benefits of COVID monoclonal antibody, and the extent to which such potential risks and benefits are unknown.  3. Information on available alternative treatments and the risks and benefits of those alternatives, including clinical trials.  4. Patients treated with COVID monoclonal antibody should continue to self-isolate and use infection control measures (e.g., wear mask, isolate, social distance, avoid sharing personal items, clean and disinfect "high touch" surfaces, and frequent handwashing) according to CDC guidelines.   5. The patient or parent/caregiver has the option to accept or refuse COVID monoclonal antibody treatment.  After reviewing this information with the patient, the patient has agreed to receive one  of the available covid 19 monoclonal antibodies and will be provided an appropriate fact sheet prior to infusion. Noreene Filbert, NP 09/29/2020 11:56 AM

## 2020-09-29 NOTE — Discharge Instructions (Signed)

## 2020-09-29 NOTE — Progress Notes (Signed)
Diagnosis: COVID-19  Physician: Dr. Patrick Wright  Procedure: [facility] Infusion [facility] Med: Sotrovimab infusion - Provided patient with sotrovimab fact sheet for patients, parents, and caregivers prior to infusion.   Complications: No immediate complications noted  Discharge: Discharged home    

## 2020-09-29 NOTE — Telephone Encounter (Signed)
Called to Discuss with patient about Covid symptoms and the use of the monoclonal antibody infusion for those with mild to moderate Covid symptoms and at a high risk of hospitalization.     Pt appears to qualify for this infusion due to co-morbid conditions and/or a member of an at-risk group in accordance with the FDA Emergency Use Authorization.    Pt stated his symptoms started on 09/28/20, he tested on 09/28/20, positive at [facility]. Symptoms include congestion, headache, fever. He states he's is 5'11", 222 pounds, QRF is BMI: 31. Pt informed an APP will call, verify information and set up with an appointment.

## 2022-01-25 ENCOUNTER — Other Ambulatory Visit: Payer: Self-pay

## 2022-01-25 ENCOUNTER — Encounter: Payer: Self-pay | Admitting: Emergency Medicine

## 2022-01-25 ENCOUNTER — Ambulatory Visit (INDEPENDENT_AMBULATORY_CARE_PROVIDER_SITE_OTHER): Payer: 59 | Admitting: Emergency Medicine

## 2022-01-25 VITALS — BP 122/76 | HR 94 | Ht 71.0 in | Wt 211.0 lb

## 2022-01-25 DIAGNOSIS — R222 Localized swelling, mass and lump, trunk: Secondary | ICD-10-CM | POA: Insufficient documentation

## 2022-01-25 DIAGNOSIS — G8929 Other chronic pain: Secondary | ICD-10-CM | POA: Diagnosis not present

## 2022-01-25 DIAGNOSIS — M79675 Pain in left toe(s): Secondary | ICD-10-CM | POA: Diagnosis not present

## 2022-01-25 DIAGNOSIS — N529 Male erectile dysfunction, unspecified: Secondary | ICD-10-CM | POA: Diagnosis not present

## 2022-01-25 MED ORDER — SILDENAFIL CITRATE 100 MG PO TABS: 50.0000 mg | ORAL_TABLET | Freq: Every day | ORAL | 11 refills | Status: AC | PRN

## 2022-01-25 NOTE — Assessment & Plan Note (Signed)
Ingrown toenail.  Advised to follow-up with podiatrist ?

## 2022-01-25 NOTE — Assessment & Plan Note (Signed)
Affecting quality of life.  Viagra prescribed.  Advised on how to use it. ?

## 2022-01-25 NOTE — Progress Notes (Signed)
Keith Hamilton 36 y.o.   Chief Complaint  Patient presents with   Mass    On right side of shouler, pain and has gotten bigger from last OV last yr.    HISTORY OF PRESENT ILLNESS: This is a 36 y.o. male complaining of a couple things: 1.  Right supraclavicular lump for 2 years, slowly getting bigger 2.  Erectile dysfunction 3.  Left big toe chronic pain No other complaints or medical concerns today.  HPI   Prior to Admission medications   Not on File    No Known Allergies  There are no problems to display for this patient.   History reviewed. No pertinent past medical history.  History reviewed. No pertinent surgical history.  Social History   Socioeconomic History   Marital status: Married    Spouse name: Not on file   Number of children: Not on file   Years of education: Not on file   Highest education level: Not on file  Occupational History   Not on file  Tobacco Use   Smoking status: Never   Smokeless tobacco: Never  Substance and Sexual Activity   Alcohol use: No   Drug use: No   Sexual activity: Not on file  Other Topics Concern   Not on file  Social History Narrative   Not on file   Social Determinants of Health   Financial Resource Strain: Not on file  Food Insecurity: Not on file  Transportation Needs: Not on file  Physical Activity: Not on file  Stress: Not on file  Social Connections: Not on file  Intimate Partner Violence: Not on file    History reviewed. No pertinent family history.   Review of Systems  Constitutional: Negative.  Negative for chills and fever.  HENT: Negative.  Negative for congestion and sore throat.   Respiratory: Negative.  Negative for cough and shortness of breath.   Cardiovascular:  Negative for chest pain and palpitations.  Gastrointestinal:  Negative for abdominal pain, nausea and vomiting.  Genitourinary: Negative.   Skin: Negative.  Negative for rash.  All other systems reviewed and are  negative.  Today's Vitals   01/25/22 1536  BP: 122/76  Pulse: 94  SpO2: 97%  Weight: 211 lb (95.7 kg)  Height: 5\' 11"  (1.803 m)   Body mass index is 29.43 kg/m.  Physical Exam Vitals reviewed.  Constitutional:      Appearance: Normal appearance.  HENT:     Head: Normocephalic.  Eyes:     Extraocular Movements: Extraocular movements intact.     Pupils: Pupils are equal, round, and reactive to light.  Cardiovascular:     Rate and Rhythm: Normal rate and regular rhythm.     Pulses: Normal pulses.     Heart sounds: Normal heart sounds.  Pulmonary:     Effort: Pulmonary effort is normal.     Breath sounds: Normal breath sounds.  Musculoskeletal:     Cervical back: No tenderness.     Right lower leg: No edema.     Left lower leg: No edema.  Lymphadenopathy:     Cervical: No cervical adenopathy.  Skin:    Capillary Refill: Capillary refill takes less than 2 seconds.     Comments: Right supraclavicular soft and mobile mass.  Nontender.  No erythema.  No fluctuation.  Neurological:     General: No focal deficit present.     Mental Status: He is alert and oriented to person, place, and time.  Psychiatric:  Mood and Affect: Mood normal.        Behavior: Behavior normal.     ASSESSMENT & PLAN: Problem List Items Addressed This Visit       Other   Supraclavicular mass - Primary    Needs further assessment with a soft tissue ultrasound.      Relevant Orders   [place] CHEST SOFT TISSUE   Erectile dysfunction    Affecting quality of life.  Viagra prescribed.  Advised on how to use it.      Relevant Medications   sildenafil (VIAGRA) 100 MG tablet   Toe pain, chronic, left    Ingrown toenail.  Advised to follow-up with podiatrist      Patient Instructions  Health Maintenance, Male Adopting a healthy lifestyle and getting preventive care are important in promoting health and wellness. Ask your health care provider about: The right schedule for you to have regular  tests and exams. Things you can do on your own to prevent diseases and keep yourself healthy. What should I know about diet, weight, and exercise? Eat a healthy diet  Eat a diet that includes plenty of vegetables, fruits, low-fat dairy products, and lean protein. Do not eat a lot of foods that are high in solid fats, added sugars, or sodium. Maintain a healthy weight Body mass index (BMI) is a measurement that can be used to identify possible weight problems. It estimates body fat based on height and weight. Your health care provider can help determine your BMI and help you achieve or maintain a healthy weight. Get regular exercise Get regular exercise. This is one of the most important things you can do for your health. Most adults should: Exercise for at least 150 minutes each week. The exercise should increase your heart rate and make you sweat (moderate-intensity exercise). Do strengthening exercises at least twice a week. This is in addition to the moderate-intensity exercise. Spend less time sitting. Even light physical activity can be beneficial. Watch cholesterol and blood lipids Have your blood tested for lipids and cholesterol at 38 years of age, then have this test every 5 years. You may need to have your cholesterol levels checked more often if: Your lipid or cholesterol levels are high. You are older than 38 years of age. You are at high risk for heart disease. What should I know about cancer screening? Many types of cancers can be detected early and may often be prevented. Depending on your health history and family history, you may need to have cancer screening at various ages. This may include screening for: Colorectal cancer. Prostate cancer. Skin cancer. Lung cancer. What should I know about heart disease, diabetes, and high blood pressure? Blood pressure and heart disease High blood pressure causes heart disease and increases the risk of stroke. This is more likely to  develop in people who have high blood pressure readings or are overweight. Talk with your health care provider about your target blood pressure readings. Have your blood pressure checked: Every 3-5 years if you are 67-17 years of age. Every year if you are 64 years old or older. If you are between the ages of 61 and 70 and are a current or former smoker, ask your health care provider if you should have a one-time screening for abdominal aortic aneurysm (AAA). Diabetes Have regular diabetes screenings. This checks your fasting blood sugar level. Have the screening done: Once every three years after age 55 if you are at a normal weight and have  a low risk for diabetes. More often and at a younger age if you are overweight or have a high risk for diabetes. What should I know about preventing infection? Hepatitis B If you have a higher risk for hepatitis B, you should be screened for this virus. Talk with your health care provider to find out if you are at risk for hepatitis B infection. Hepatitis C Blood testing is recommended for: Everyone born from [birth year] through 1965. Anyone with known risk factors for hepatitis C. Sexually transmitted infections (STIs) You should be screened each year for STIs, including gonorrhea and chlamydia, if: You are sexually active and are younger than 38 years of age. You are older than 38 years of age and your health care provider tells you that you are at risk for this type of infection. Your sexual activity has changed since you were last screened, and you are at increased risk for chlamydia or gonorrhea. Ask your health care provider if you are at risk. Ask your health care provider about whether you are at high risk for HIV. Your health care provider may recommend a prescription medicine to help prevent HIV infection. If you choose to take medicine to prevent HIV, you should first get tested for HIV. You should then be tested every 3 months for as long as you are  taking the medicine. Follow these instructions at home: Alcohol use Do not drink alcohol if your health care provider tells you not to drink. If you drink alcohol: Limit how much you have to 0-2 drinks a day. Know how much alcohol is in your drink. In the [place] one drink equals one 12 oz bottle of beer (355 mL), one 5 oz glass of wine (148 mL), or one 1 oz glass of hard liquor (44 mL). Lifestyle Do not use any products that contain nicotine or tobacco. These products include cigarettes, chewing tobacco, and vaping devices, such as e-cigarettes. If you need help quitting, ask your health care provider. Do not use street drugs. Do not share needles. Ask your health care provider for help if you need support or information about quitting drugs. General instructions Schedule regular health, dental, and eye exams. Stay current with your vaccines. Tell your health care provider if: You often feel depressed. You have ever been abused or do not feel safe at home. Summary Adopting a healthy lifestyle and getting preventive care are important in promoting health and wellness. Follow your health care provider's instructions about healthy diet, exercising, and getting tested or screened for diseases. Follow your health care provider's instructions on monitoring your cholesterol and blood pressure. This information is not intended to replace advice given to you by your health care provider. Make sure you discuss any questions you have with your health care provider. Document Revised: 03/22/2021 Document Reviewed: 03/22/2021 Elsevier Patient Education  2022 [employer].     Edwina Barth, MD [facility]

## 2022-01-25 NOTE — Patient Instructions (Signed)

## 2022-01-25 NOTE — Assessment & Plan Note (Signed)
Needs further assessment with a soft tissue ultrasound. ?

## 2022-02-03 ENCOUNTER — Ambulatory Visit
Admission: RE | Admit: 2022-02-03 | Discharge: 2022-02-03 | Disposition: A | Discharge status: Home / Self Care | Payer: 59 | Source: Ambulatory Visit | Attending: Emergency Medicine | Admitting: Emergency Medicine

## 2022-02-03 DIAGNOSIS — R222 Localized swelling, mass and lump, trunk: Secondary | ICD-10-CM

## 2022-02-06 ENCOUNTER — Other Ambulatory Visit: Payer: Self-pay | Admitting: Emergency Medicine

## 2022-02-06 DIAGNOSIS — R222 Localized swelling, mass and lump, trunk: Secondary | ICD-10-CM

## 2022-06-14 ENCOUNTER — Encounter: Payer: Self-pay | Admitting: Emergency Medicine

## 2022-06-14 ENCOUNTER — Ambulatory Visit (INDEPENDENT_AMBULATORY_CARE_PROVIDER_SITE_OTHER): Payer: 59 | Admitting: Emergency Medicine

## 2022-06-14 VITALS — BP 110/84 | HR 70 | Temp 98.5°F | Ht 71.0 in | Wt 217.2 lb

## 2022-06-14 DIAGNOSIS — Z13228 Encounter for screening for other metabolic disorders: Secondary | ICD-10-CM | POA: Diagnosis not present

## 2022-06-14 DIAGNOSIS — Z1329 Encounter for screening for other suspected endocrine disorder: Secondary | ICD-10-CM | POA: Diagnosis not present

## 2022-06-14 DIAGNOSIS — Z114 Encounter for screening for human immunodeficiency virus [HIV]: Secondary | ICD-10-CM

## 2022-06-14 DIAGNOSIS — Z0000 Encounter for general adult medical examination without abnormal findings: Secondary | ICD-10-CM

## 2022-06-14 DIAGNOSIS — Z1159 Encounter for screening for other viral diseases: Secondary | ICD-10-CM

## 2022-06-14 DIAGNOSIS — Z13220 Encounter for screening for lipoid disorders: Secondary | ICD-10-CM | POA: Diagnosis not present

## 2022-06-14 DIAGNOSIS — Z130 Encounter for screening for diseases of the blood and blood-forming organs and certain disorders involving the immune mechanism: Secondary | ICD-10-CM

## 2022-06-14 LAB — COMPREHENSIVE METABOLIC PANEL
ALT: 25 U/L (ref 0–53)
AST: 23 U/L (ref 0–37)
Albumin: 4.4 g/dL (ref 3.5–5.2)
Alkaline Phosphatase: 81 U/L (ref 39–117)
BUN: 16 mg/dL (ref 6–23)
CO2: 27 mEq/L (ref 19–32)
Calcium: 9.4 mg/dL (ref 8.4–10.5)
Chloride: 103 mEq/L (ref 96–112)
Creatinine, Ser: 1.11 mg/dL (ref 0.40–1.50)
GFR: 84.31 mL/min (ref >60.00)
Glucose, Bld: 90 mg/dL (ref 70–99)
Potassium: 3.8 mEq/L (ref 3.5–5.1)
Sodium: 137 mEq/L (ref 135–145)
Total Bilirubin: 0.6 mg/dL (ref 0.2–1.2)
Total Protein: 7.7 g/dL (ref 6.0–8.3)

## 2022-06-14 LAB — CBC WITH DIFFERENTIAL/PLATELET
Basophils Absolute: 0.1 10*3/uL (ref 0.0–0.1)
Basophils Relative: 1.8 % (ref 0.0–3.0)
Eosinophils Absolute: 0.2 10*3/uL (ref 0.0–0.7)
Eosinophils Relative: 2.3 % (ref 0.0–5.0)
HCT: 45.4 % (ref 39.0–52.0)
Hemoglobin: 15.4 g/dL (ref 13.0–17.0)
Lymphocytes Relative: 27.1 % (ref 12.0–46.0)
Lymphs Abs: 2 10*3/uL (ref 0.7–4.0)
MCHC: 34 g/dL (ref 30.0–36.0)
MCV: 93.8 fl (ref 78.0–100.0)
Monocytes Absolute: 0.7 10*3/uL (ref 0.1–1.0)
Monocytes Relative: 9.8 % (ref 3.0–12.0)
Neutro Abs: 4.3 10*3/uL (ref 1.4–7.7)
Neutrophils Relative %: 59 % (ref 43.0–77.0)
Platelets: 237 10*3/uL (ref 150.0–400.0)
RBC: 4.84 Mil/uL (ref 4.22–5.81)
RDW: 14.3 % (ref 11.5–15.5)
WBC: 7.3 10*3/uL (ref 4.0–10.5)

## 2022-06-14 LAB — LIPID PANEL
Cholesterol: 192 mg/dL (ref 0–200)
HDL: 52 mg/dL (ref >39.00)
LDL Cholesterol: 116 mg/dL — ABNORMAL HIGH (ref 0–99)
NonHDL: 140.04
Total CHOL/HDL Ratio: 4
Triglycerides: 122 mg/dL (ref 0.0–149.0)
VLDL: 24.4 mg/dL (ref 0.0–40.0)

## 2022-06-14 LAB — HEMOGLOBIN A1C: Hgb A1c MFr Bld: 5.8 % (ref 4.6–6.5)

## 2022-06-14 NOTE — Patient Instructions (Signed)
Health Maintenance, Male Adopting a healthy lifestyle and getting preventive care are important in promoting health and wellness. Ask your health care provider about: The right schedule for you to have regular tests and exams. Things you can do on your own to prevent diseases and keep yourself healthy. What should I know about diet, weight, and exercise? Eat a healthy diet  Eat a diet that includes plenty of vegetables, fruits, low-fat dairy products, and lean protein. Do not eat a lot of foods that are high in solid fats, added sugars, or sodium. Maintain a healthy weight Body mass index (BMI) is a measurement that can be used to identify possible weight problems. It estimates body fat based on height and weight. Your health care provider can help determine your BMI and help you achieve or maintain a healthy weight. Get regular exercise Get regular exercise. This is one of the most important things you can do for your health. Most adults should: Exercise for at least 150 minutes each week. The exercise should increase your heart rate and make you sweat (moderate-intensity exercise). Do strengthening exercises at least twice a week. This is in addition to the moderate-intensity exercise. Spend less time sitting. Even light physical activity can be beneficial. Watch cholesterol and blood lipids Have your blood tested for lipids and cholesterol at 38 years of age, then have this test every 5 years. You may need to have your cholesterol levels checked more often if: Your lipid or cholesterol levels are high. You are older than 38 years of age. You are at high risk for heart disease. What should I know about cancer screening? Many types of cancers can be detected early and may often be prevented. Depending on your health history and family history, you may need to have cancer screening at various ages. This may include screening for: Colorectal cancer. Prostate cancer. Skin cancer. Lung  cancer. What should I know about heart disease, diabetes, and high blood pressure? Blood pressure and heart disease High blood pressure causes heart disease and increases the risk of stroke. This is more likely to develop in people who have high blood pressure readings or are overweight. Talk with your health care provider about your target blood pressure readings. Have your blood pressure checked: Every 3-5 years if you are 18-39 years of age. Every year if you are 40 years old or older. If you are between the ages of 65 and 75 and are a current or former smoker, ask your health care provider if you should have a one-time screening for abdominal aortic aneurysm (AAA). Diabetes Have regular diabetes screenings. This checks your fasting blood sugar level. Have the screening done: Once every three years after age 45 if you are at a normal weight and have a low risk for diabetes. More often and at a younger age if you are overweight or have a high risk for diabetes. What should I know about preventing infection? Hepatitis B If you have a higher risk for hepatitis B, you should be screened for this virus. Talk with your health care provider to find out if you are at risk for hepatitis B infection. Hepatitis C Blood testing is recommended for: Everyone born from 1945 through 1965. Anyone with known risk factors for hepatitis C. Sexually transmitted infections (STIs) You should be screened each year for STIs, including gonorrhea and chlamydia, if: You are sexually active and are younger than 38 years of age. You are older than 38 years of age and your   health care provider tells you that you are at risk for this type of infection. Your sexual activity has changed since you were last screened, and you are at increased risk for chlamydia or gonorrhea. Ask your health care provider if you are at risk. Ask your health care provider about whether you are at high risk for HIV. Your health care provider  may recommend a prescription medicine to help prevent HIV infection. If you choose to take medicine to prevent HIV, you should first get tested for HIV. You should then be tested every 3 months for as long as you are taking the medicine. Follow these instructions at home: Alcohol use Do not drink alcohol if your health care provider tells you not to drink. If you drink alcohol: Limit how much you have to 0-2 drinks a day. Know how much alcohol is in your drink. In the [place] one drink equals one 12 oz bottle of beer (355 mL), one 5 oz glass of wine (148 mL), or one 1 oz glass of hard liquor (44 mL). Lifestyle Do not use any products that contain nicotine or tobacco. These products include cigarettes, chewing tobacco, and vaping devices, such as e-cigarettes. If you need help quitting, ask your health care provider. Do not use street drugs. Do not share needles. Ask your health care provider for help if you need support or information about quitting drugs. General instructions Schedule regular health, dental, and eye exams. Stay current with your vaccines. Tell your health care provider if: You often feel depressed. You have ever been abused or do not feel safe at home. Summary Adopting a healthy lifestyle and getting preventive care are important in promoting health and wellness. Follow your health care provider's instructions about healthy diet, exercising, and getting tested or screened for diseases. Follow your health care provider's instructions on monitoring your cholesterol and blood pressure. This information is not intended to replace advice given to you by your health care provider. Make sure you discuss any questions you have with your health care provider. Document Revised: 03/22/2021 Document Reviewed: 03/22/2021 Elsevier Patient Education  2023 [employer].  

## 2022-06-14 NOTE — Progress Notes (Signed)
Keith Hamilton 36 y.o.   Chief Complaint  Patient presents with   Annual Exam    HISTORY OF PRESENT ILLNESS: This is a 36 y.o. male here for annual exam. Healthy male with a healthy lifestyle. No chronic medical problems.  Non-smoker. No chronic medications. Has no complaints or any other medical concerns today.  HPI   Prior to Admission medications   Medication Sig Start Date End Date Taking? Authorizing Provider  sildenafil (VIAGRA) 100 MG tablet Take 0.5-1 tablets (50-100 mg total) by mouth daily as needed for erectile dysfunction. 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  No past surgical history on file.  Social History   Socioeconomic History   Marital status: Married    Spouse name: Not on file   Number of children: Not on file   Years of education: Not on file   Highest education level: Not on file  Occupational History   Not on file  Tobacco Use   Smoking status: Never   Smokeless tobacco: Never  Substance and Sexual Activity   Alcohol use: No   Drug use: No   Sexual activity: Not on file  Other Topics Concern   Not on file  Social History Narrative   Not on file   Social Determinants of Health   Financial Resource Strain: Not on file  Food Insecurity: Not on file  Transportation Needs: Not on file  Physical Activity: Not on file  Stress: Not on file  Social Connections: Not on file  Intimate Partner Violence: Not on file    No family history on file.   Review of Systems  Constitutional: Negative.  Negative for chills and fever.  HENT: Negative.  Negative for congestion and sore throat.   Respiratory: Negative.  Negative for cough and shortness of breath.   Cardiovascular: Negative.  Negative for chest pain and  palpitations.  Gastrointestinal: Negative.  Negative for abdominal pain, diarrhea, nausea and vomiting.  Genitourinary: Negative.  Negative for dysuria.  Skin: Negative.  Negative for rash.  Neurological:  Negative for dizziness and headaches.  Endo/Heme/Allergies: Negative.   All other systems reviewed and are negative.  Today's Vitals   06/14/22 0812  BP: 110/84  Pulse: 70  Temp: 98.5 F (36.9 C)  TempSrc: Oral  SpO2: 98%  Weight: 217 lb 4 oz (98.5 kg)  Height: 5\' 11"  (1.803 m)   Body mass index is 30.3 kg/m. Wt Readings from Last 3 Encounters:  06/14/22 217 lb 4 oz (98.5 kg)  01/25/22 211 lb (95.7 kg)  03/17/20 217 lb (98.4 kg)     Physical Exam Vitals reviewed.  Constitutional:      Appearance: Normal appearance.  HENT:     Head: Normocephalic.     Right Ear: Tympanic membrane, ear canal and external ear normal.     Left Ear: Tympanic membrane, ear canal and external ear normal.     Mouth/Throat:     Mouth: Mucous membranes are moist.     Pharynx: Oropharynx is clear.  Eyes:     Extraocular Movements: Extraocular movements intact.     Conjunctiva/sclera: Conjunctivae normal.     Pupils: Pupils are equal, round, and reactive to light.  Cardiovascular:     Rate and Rhythm: Normal  rate and regular rhythm.     Pulses: Normal pulses.     Heart sounds: Normal heart sounds.  Pulmonary:     Effort: Pulmonary effort is normal.     Breath sounds: Normal breath sounds.  Abdominal:     General: There is no distension.     Palpations: Abdomen is soft.     Tenderness: There is no abdominal tenderness.  Musculoskeletal:     Cervical back: No tenderness.  Lymphadenopathy:     Cervical: No cervical adenopathy.  Skin:    General: Skin is warm and dry.     Capillary Refill: Capillary refill takes less than 2 seconds.  Neurological:     General: No focal deficit present.     Mental Status: He is alert and oriented to person, place, and time.  Psychiatric:        Mood  and Affect: Mood normal.        Behavior: Behavior normal.      ASSESSMENT & PLAN: Problem List Items Addressed This Visit   None Visit Diagnoses     Routine general medical examination at a health care facility    -  Primary   Need for hepatitis C screening test       Relevant Orders   Hepatitis C antibody screen   Screening for HIV (human immunodeficiency virus)       Relevant Orders   HIV antibody   Screening for deficiency anemia       Relevant Orders   CBC with Differential   Screening for lipoid disorders       Relevant Orders   Lipid panel   Screening for endocrine, metabolic and immunity disorder       Relevant Orders   Comprehensive metabolic panel   Hemoglobin A1c      Modifiable risk factors discussed with patient. Anticipatory guidance according to age provided. The following topics were also discussed: Social Determinants of Health Smoking.  Non-smoker Diet and nutrition and need to decrease amount of daily carbohydrate intake and daily calories Benefits of exercise Cancer family history review Vaccinations recommendations Cardiovascular risk assessment and need for blood work Mental health including depression and anxiety Fall and accident prevention  Patient Instructions  Health Maintenance, Male Adopting a healthy lifestyle and getting preventive care are important in promoting health and wellness. Ask your health care provider about: The right schedule for you to have regular tests and exams. Things you can do on your own to prevent diseases and keep yourself healthy. What should I know about diet, weight, and exercise? Eat a healthy diet  Eat a diet that includes plenty of vegetables, fruits, low-fat dairy products, and lean protein. Do not eat a lot of foods that are high in solid fats, added sugars, or sodium. Maintain a healthy weight Body mass index (BMI) is a measurement that can be used to identify possible weight problems. It estimates  body fat based on height and weight. Your health care provider can help determine your BMI and help you achieve or maintain a healthy weight. Get regular exercise Get regular exercise. This is one of the most important things you can do for your health. Most adults should: Exercise for at least 150 minutes each week. The exercise should increase your heart rate and make you sweat (moderate-intensity exercise). Do strengthening exercises at least twice a week. This is in addition to the moderate-intensity exercise. Spend less time sitting. Even light physical activity can be beneficial. Watch cholesterol  and blood lipids Have your blood tested for lipids and cholesterol at 38 years of age, then have this test every 5 years. You may need to have your cholesterol levels checked more often if: Your lipid or cholesterol levels are high. You are older than 38 years of age. You are at high risk for heart disease. What should I know about cancer screening? Many types of cancers can be detected early and may often be prevented. Depending on your health history and family history, you may need to have cancer screening at various ages. This may include screening for: Colorectal cancer. Prostate cancer. Skin cancer. Lung cancer. What should I know about heart disease, diabetes, and high blood pressure? Blood pressure and heart disease High blood pressure causes heart disease and increases the risk of stroke. This is more likely to develop in people who have high blood pressure readings or are overweight. Talk with your health care provider about your target blood pressure readings. Have your blood pressure checked: Every 3-5 years if you are 51-35 years of age. Every year if you are 50 years old or older. If you are between the ages of 32 and 63 and are a current or former smoker, ask your health care provider if you should have a one-time screening for abdominal aortic aneurysm (AAA). Diabetes Have  regular diabetes screenings. This checks your fasting blood sugar level. Have the screening done: Once every three years after age 34 if you are at a normal weight and have a low risk for diabetes. More often and at a younger age if you are overweight or have a high risk for diabetes. What should I know about preventing infection? Hepatitis B If you have a higher risk for hepatitis B, you should be screened for this virus. Talk with your health care provider to find out if you are at risk for hepatitis B infection. Hepatitis C Blood testing is recommended for: Everyone born from [birth year] through 1967. Anyone with known risk factors for hepatitis C. Sexually transmitted infections (STIs) You should be screened each year for STIs, including gonorrhea and chlamydia, if: You are sexually active and are younger than 38 years of age. You are older than 38 years of age and your health care provider tells you that you are at risk for this type of infection. Your sexual activity has changed since you were last screened, and you are at increased risk for chlamydia or gonorrhea. Ask your health care provider if you are at risk. Ask your health care provider about whether you are at high risk for HIV. Your health care provider may recommend a prescription medicine to help prevent HIV infection. If you choose to take medicine to prevent HIV, you should first get tested for HIV. You should then be tested every 3 months for as long as you are taking the medicine. Follow these instructions at home: Alcohol use Do not drink alcohol if your health care provider tells you not to drink. If you drink alcohol: Limit how much you have to 0-2 drinks a day. Know how much alcohol is in your drink. In the [place] one drink equals one 12 oz bottle of beer (355 mL), one 5 oz glass of wine (148 mL), or one 1 oz glass of hard liquor (44 mL). Lifestyle Do not use any products that contain nicotine or tobacco. These products  include cigarettes, chewing tobacco, and vaping devices, such as e-cigarettes. If you need help quitting, ask your health care provider.  Do not use street drugs. Do not share needles. Ask your health care provider for help if you need support or information about quitting drugs. General instructions Schedule regular health, dental, and eye exams. Stay current with your vaccines. Tell your health care provider if: You often feel depressed. You have ever been abused or do not feel safe at home. Summary Adopting a healthy lifestyle and getting preventive care are important in promoting health and wellness. Follow your health care provider's instructions about healthy diet, exercising, and getting tested or screened for diseases. Follow your health care provider's instructions on monitoring your cholesterol and blood pressure. This information is not intended to replace advice given to you by your health care provider. Make sure you discuss any questions you have with your health care provider. Document Revised: 03/22/2021 Document Reviewed: 03/22/2021 Elsevier Patient Education  [place], MD [facility]

## 2022-06-15 LAB — HIV ANTIBODY (ROUTINE TESTING W REFLEX): HIV 1&2 Ab, 4th Generation: NONREACTIVE

## 2022-06-15 LAB — HEPATITIS C ANTIBODY: Hepatitis C Ab: NONREACTIVE

## 2023-01-09 ENCOUNTER — Telehealth: Payer: Self-pay | Admitting: Emergency Medicine

## 2023-01-09 ENCOUNTER — Encounter: Payer: Self-pay | Admitting: Emergency Medicine

## 2023-01-09 ENCOUNTER — Ambulatory Visit (INDEPENDENT_AMBULATORY_CARE_PROVIDER_SITE_OTHER): Payer: 59 | Admitting: Emergency Medicine

## 2023-01-09 VITALS — BP 130/82 | HR 78 | Temp 98.2°F | Ht 71.0 in | Wt 220.0 lb

## 2023-01-09 DIAGNOSIS — L089 Local infection of the skin and subcutaneous tissue, unspecified: Secondary | ICD-10-CM | POA: Diagnosis not present

## 2023-01-09 DIAGNOSIS — Z8614 Personal history of Methicillin resistant Staphylococcus aureus infection: Secondary | ICD-10-CM

## 2023-01-09 DIAGNOSIS — L723 Sebaceous cyst: Secondary | ICD-10-CM

## 2023-01-09 MED ORDER — DOXYCYCLINE HYCLATE 100 MG PO TABS: 100.0000 mg | ORAL_TABLET | Freq: Two times a day (BID) | ORAL | 0 refills | Status: AC

## 2023-01-09 NOTE — Telephone Encounter (Signed)
Patient states he got the name of the medicine wrong that he was taking previously, wants to make sure the medicine he was prescribed today was still okay to take. Patient would like a callback at [phone].

## 2023-01-09 NOTE — Progress Notes (Signed)
Keith Hamilton 37 y.o.   Chief Complaint  Patient presents with   Acute Visit    Painful bump left armpit, painful, red, poss cyst, 4 weeks ago same cyst was drained, swab came back positive MRSA    HISTORY OF PRESENT ILLNESS: This is a 37 y.o. male complaining of painful cyst to the left axillary area History of recent infected cyst in the same area about 4 weeks ago.  Treated with doxycycline.  Culture grew MRSA. No other complaints or medical concerns today.  HPI   Prior to Admission medications   Medication Sig Start Date End Date Taking? Authorizing Provider  sildenafil (VIAGRA) 100 MG tablet Take 0.5-1 tablets (50-100 mg total) by mouth daily as needed for erectile dysfunction. 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  No past surgical history on file.  Social History   Socioeconomic History   Marital status: Married    Spouse name: Not on file   Number of children: Not on file   Years of education: Not on file   Highest education level: Not on file  Occupational History   Not on file  Tobacco Use   Smoking status: Never   Smokeless tobacco: Never  Substance and Sexual Activity   Alcohol use: No   Drug use: No   Sexual activity: Not on file  Other Topics Concern   Not on file  Social History Narrative   Not on file   Social Determinants of Health   Financial Resource Strain: Not on file  Food Insecurity: Not on file  Transportation Needs: Not on file  Physical Activity: Not on file  Stress: Not on file  Social Connections: Not on file  Intimate Partner Violence: Not on file    No family history on file.   Review of Systems  Constitutional: Negative.  Negative for fever.  HENT: Negative.  Negative for congestion and sore  throat.   Respiratory: Negative.  Negative for cough and shortness of breath.   Cardiovascular: Negative.  Negative for chest pain and palpitations.  Gastrointestinal:  Negative for nausea and vomiting.  Genitourinary: Negative.   Skin: Negative.  Negative for rash.  Neurological: Negative.  Negative for dizziness and headaches.  All other systems reviewed and are negative.  Today's Vitals   01/09/23 1323  BP: 130/82  Pulse: 78  Temp: 98.2 F (36.8 C)  TempSrc: Oral  SpO2: 98%  Weight: 220 lb (99.8 kg)  Height: '5\' 11"'$  (1.803 m)   Body mass index is 30.68 kg/m.   Physical Exam Vitals reviewed.  Constitutional:      Appearance: Normal appearance.  HENT:     Head: Normocephalic.  Eyes:     Extraocular Movements: Extraocular movements intact.  Cardiovascular:     Rate and Rhythm: Normal rate.  Pulmonary:     Effort: Pulmonary effort is normal.  Skin:    General: Skin is warm and dry.     Comments: Left axillary area: Small tender nonfluctuant infected cyst about 1.5 cm diameter  Neurological:     Mental Status: He is alert and oriented to person, place, and time.  Psychiatric:        Mood and Affect: Mood normal.        Behavior:  Behavior normal.      ASSESSMENT & PLAN: A total of 32 minutes was spent with the patient and counseling/coordination of care regarding preparing for this visit, review of most recent office visit notes, review of past medical history, review of all medications, diagnosis of infected sebaceous cyst and need to start antibiotics, I&D precautions, prognosis, documentation and need for follow-up if no better or worse during the next several days.  Problem List Items Addressed This Visit       Musculoskeletal and Integument   Infected sebaceous cyst - Primary    Early infection.  History of MRSA. No fluctuation on physical exam.  Maybe early abscess Will start doxycycline 100 mg twice a day If no better or bigger in the next several days we  will do I&D as needed Advised to apply warm compresses several times a day for the next several days Tylenol and or Advil for pain.      Relevant Medications   doxycycline (VIBRA-TABS) 100 MG tablet     Other   History of MRSA infection   Relevant Medications   doxycycline (VIBRA-TABS) 100 MG tablet   Patient Instructions  Epidermoid Cyst  An epidermoid cyst, also called an epidermal cyst, is a small lump under your skin. The cyst contains a substance called keratin. Do not try to pop or open the cyst yourself. What are the causes? A blocked hair follicle. A hair that curls and re-enters the skin instead of growing straight out of the skin. A blocked pore. Irritated skin. An injury to the skin. Certain conditions that are passed along from parent to child. Human papillomavirus (HPV). This happens rarely when cysts occur on the bottom of the feet. Long-term sun damage to the skin. What increases the risk? Having acne. Being male. Having an injury to the skin. Being past puberty. Having certain conditions caused by genes (genetic disorder) What are the signs or symptoms? These cysts are usually harmless, but they can get infected. Symptoms of infection may include: Redness. Inflammation. Tenderness. Warmth. Fever. A bad-smelling substance that drains from the cyst. Pus that drains from the cyst. How is this treated? In many cases, epidermoid cysts go away on their own without treatment. If a cyst becomes infected, treatment may include: Opening and draining the cyst, done by a doctor. After draining, you may need minor surgery to remove the rest of the cyst. Antibiotic medicine. Shots of medicines (steroids) that help to reduce inflammation. Surgery to remove the cyst. Surgery may be done if the cyst: Becomes large. Bothers you. Has a chance of turning into cancer. Do not try to open a cyst yourself. Follow these instructions at home: Medicines Take over-the-counter  and prescription medicines as told by your doctor. If you were prescribed an antibiotic medicine, take it as told by your doctor. Do not stop taking it even if you start to feel better. General instructions Keep the area around your cyst clean and dry. Wear loose, dry clothing. Avoid touching your cyst. Check your cyst every day for signs of infection. Check for: Redness, swelling, or pain. Fluid or blood. Warmth. Pus or a bad smell. Keep all follow-up visits. How is this prevented? Wear clean, dry, clothing. Avoid wearing tight clothing. Keep your skin clean and dry. Take showers or baths every day. Contact a doctor if: Your cyst has symptoms of infection. Your condition does not improve or gets worse. You have a cyst that looks different from other cysts you have had. You  have a fever. Get help right away if: Redness spreads from the cyst into the area close by. Summary An epidermoid cyst is a small lump under your skin. If a cyst becomes infected, treatment may include surgery to open and drain the cyst, or to remove it. Take over-the-counter and prescription medicines only as told by your doctor. Contact a doctor if your condition is not improving or is getting worse. Keep all follow-up visits. This information is not intended to replace advice given to you by your health care provider. Make sure you discuss any questions you have with your health care provider. Document Revised: 02/05/2020 Document Reviewed: 02/05/2020 Elsevier Patient Education  [place], MD [facility]

## 2023-01-09 NOTE — Assessment & Plan Note (Signed)
Early infection.  History of MRSA. No fluctuation on physical exam.  Maybe early abscess Will start doxycycline 100 mg twice a day If no better or bigger in the next several days we will do I&D as needed Advised to apply warm compresses several times a day for the next several days Tylenol and or Advil for pain.

## 2023-01-09 NOTE — Telephone Encounter (Signed)
Called patient and informed him that the abx that he was taking previously is different from the one he was prescribed today and it was ok for him to take the med he got today.

## 2023-01-09 NOTE — Patient Instructions (Signed)
Epidermoid Cyst  An epidermoid cyst, also called an epidermal cyst, is a small lump under your skin. The cyst contains a substance called keratin. Do not try to pop or open the cyst yourself. What are the causes? A blocked hair follicle. A hair that curls and re-enters the skin instead of growing straight out of the skin. A blocked pore. Irritated skin. An injury to the skin. Certain conditions that are passed along from parent to child. Human papillomavirus (HPV). This happens rarely when cysts occur on the bottom of the feet. Long-term sun damage to the skin. What increases the risk? Having acne. Being male. Having an injury to the skin. Being past puberty. Having certain conditions caused by genes (genetic disorder) What are the signs or symptoms? These cysts are usually harmless, but they can get infected. Symptoms of infection may include: Redness. Inflammation. Tenderness. Warmth. Fever. A bad-smelling substance that drains from the cyst. Pus that drains from the cyst. How is this treated? In many cases, epidermoid cysts go away on their own without treatment. If a cyst becomes infected, treatment may include: Opening and draining the cyst, done by a doctor. After draining, you may need minor surgery to remove the rest of the cyst. Antibiotic medicine. Shots of medicines (steroids) that help to reduce inflammation. Surgery to remove the cyst. Surgery may be done if the cyst: Becomes large. Bothers you. Has a chance of turning into cancer. Do not try to open a cyst yourself. Follow these instructions at home: Medicines Take over-the-counter and prescription medicines as told by your doctor. If you were prescribed an antibiotic medicine, take it as told by your doctor. Do not stop taking it even if you start to feel better. General instructions Keep the area around your cyst clean and dry. Wear loose, dry clothing. Avoid touching your cyst. Check your cyst every day  for signs of infection. Check for: Redness, swelling, or pain. Fluid or blood. Warmth. Pus or a bad smell. Keep all follow-up visits. How is this prevented? Wear clean, dry, clothing. Avoid wearing tight clothing. Keep your skin clean and dry. Take showers or baths every day. Contact a doctor if: Your cyst has symptoms of infection. Your condition does not improve or gets worse. You have a cyst that looks different from other cysts you have had. You have a fever. Get help right away if: Redness spreads from the cyst into the area close by. Summary An epidermoid cyst is a small lump under your skin. If a cyst becomes infected, treatment may include surgery to open and drain the cyst, or to remove it. Take over-the-counter and prescription medicines only as told by your doctor. Contact a doctor if your condition is not improving or is getting worse. Keep all follow-up visits. This information is not intended to replace advice given to you by your health care provider. Make sure you discuss any questions you have with your health care provider. Document Revised: 02/05/2020 Document Reviewed: 02/05/2020 Elsevier Patient Education  2023 [employer]  

## 2023-03-30 ENCOUNTER — Ambulatory Visit (INDEPENDENT_AMBULATORY_CARE_PROVIDER_SITE_OTHER): Payer: 59 | Admitting: Emergency Medicine

## 2023-03-30 ENCOUNTER — Encounter: Payer: Self-pay | Admitting: Emergency Medicine

## 2023-03-30 VITALS — BP 120/74 | HR 77 | Temp 98.6°F | Ht 71.0 in | Wt 221.1 lb

## 2023-03-30 DIAGNOSIS — Z8614 Personal history of Methicillin resistant Staphylococcus aureus infection: Secondary | ICD-10-CM | POA: Diagnosis not present

## 2023-03-30 DIAGNOSIS — L089 Local infection of the skin and subcutaneous tissue, unspecified: Secondary | ICD-10-CM

## 2023-03-30 MED ORDER — DOXYCYCLINE HYCLATE 100 MG PO TABS: 100.0000 mg | ORAL_TABLET | Freq: Two times a day (BID) | ORAL | 1 refills | Status: AC

## 2023-03-30 NOTE — Patient Instructions (Signed)
MRSA Infection, Diagnosis, Adult Methicillin-resistant Staphylococcus aureus (MRSA) infection is caused by bacteria called Staphylococcus aureus, or staph, that no longer respond to common antibiotic medicines (drug-resistant bacteria). MRSA infection can be hard to treat. Most of the time, MRSA can be on the skin or in the nose without causing problems (colonized). However, if MRSA enters the body through a cut, a sore, or an invasive medical device, it can cause a serious infection. What are the causes? This condition is caused by staph bacteria. Illness may develop after exposure to the bacteria through: Skin-to-skin contact with someone who is infected with MRSA. Touching surfaces that have the bacteria on them. Having a procedure or using equipment that allows MRSA to enter the body. Having MRSA that lives on your skin and then enters your body through: A cut or scratch. A surgery or procedure. The use of a medical device. Contact with the bacteria may occur: During a stay in a hospital, rehabilitation facility, nursing home, or other health care facility (health care-associated MRSA). In daily activities where there is close contact with others, such as sports, child care centers, or at home (community-associated MRSA). What increases the risk? You are more likely to develop this condition if you: Have a surgery or procedure. Have an IV or a thin tube (catheter) placed in your body. Are elderly. Are on kidney dialysis. Have recently taken an antibiotic medicine. Live in a long-term care facility. Have a chronic wound or skin ulcer. Have a weak body defense system (immune system). Play sports that involve skin-to-skin contact. Live in a crowded place, like a dormitory or military barracks. Share towels, razors, or sports equipment with other people. Have a history of MRSA infection or colonization. What are the signs or symptoms? Symptoms of this condition depend on the area that  is affected. Symptoms may include: A pus-filled pimple or boil. Pus that drains from your skin. A sore (abscess) under your skin or somewhere in your body. Fever with or without chills. Difficulty breathing. Coughing up blood. Redness, warmth, swelling, or pain in the affected area. How is this diagnosed? This condition may be diagnosed based on: A physical exam. Your medical history. Taking a sample from the infected area and growing it in a lab (culture). You may also have other tests, including: Imaging tests, such as X-rays, a CT scan, or an MRI. Lab tests, such as blood, urine, or phlegm (sputum) tests. You skin or nose may be swabbed when you are admitted to a health care facility for a procedure. This is to screen for MRSA. How is this treated? Treatment depends on the type of MRSA infection you have and how severe, deep, or extensive it is. Treatment may include: Antibiotic medicines. Surgery to drain pus from the infected area. Severe infections may require a hospital stay. Follow these instructions at home: Medicines Take over-the-counter and prescription medicines only as told by your health care provider. If you were prescribed an antibiotic medicine, use it as told by your health care provider. Do not stop using the antibiotic even if you start to feel better. Prevention Follow these instructions to avoid spreading the infection to others: Wash your hands frequently with soap and water. If soap and water are not available, use an alcohol-based hand sanitizer. Avoid close contact with those around you as much as possible. Do not use towels, razors, toothbrushes, bedding, or other items that will be used by others. Wash towels, bedding, and clothes in the washing machine with detergent   and hot water. Dry them in a hot dryer. Clean surfaces regularly to remove germs (disinfection). Use products or solutions that contain bleach. Make sure you disinfect bathroom surfaces, food  preparation areas, exercise equipment, and doorknobs.  General instructions If you have a wound, follow instructions from your health care provider about how to take care of your wound. Do not pick at scabs. Do not try to drain any infection sites or pimples. Tell all your health care providers that you have MRSA, or if you have ever had a MRSA infection. Keep all follow-up visits as told by your health care provider. This is important. Contact a health care provider if you: Do not get better. Have symptoms that get worse. Have new symptoms. Get help right away if you have: Nausea or vomiting, or if you cannot take medicine without vomiting. Trouble breathing. Chest pain. These symptoms may represent a serious problem that is an emergency. Do not wait to see if the symptoms will go away. Get medical help right away. Call your local emergency services (911 in the [place] Do not drive yourself to the hospital. Summary MRSA infection is caused by bacteria called Staphylococcus aureus, or staph, that no longer respond to common antibiotic medicines. Treatment for this condition depends on the type of MRSA infection you have and how severe, deep, and extensive it is. If you were prescribed an antibiotic medicine, use it as told by your health care provider. Do not stop using the antibiotic even if you start to feel better. Follow instructions from your health care provider to avoid spreading the infection to others. This information is not intended to replace advice given to you by your health care provider. Make sure you discuss any questions you have with your health care provider. Document Revised: 09/15/2022 Document Reviewed: 09/15/2022 Elsevier Patient Education  2023 Elsevier Inc.  

## 2023-03-30 NOTE — Progress Notes (Signed)
Keith Hamilton 37 y.o.   Chief Complaint  Patient presents with   Rash    Patient states he tested positive for MRSA a few months ago,  skin rash under his left armpit, very painful     HISTORY OF PRESENT ILLNESS: Acute problem visit today. This is a 37 y.o. 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  Rash Pertinent negatives include no congestion, cough, fever, shortness of breath, sore throat or vomiting.     Prior to Admission medications   Medication Sig Start Date End Date Taking? Authorizing Provider  doxycycline (VIBRA-TABS) 100 MG tablet Take 1 tablet (100 mg total) by mouth 2 (two) times daily for 7 days. 03/30/23 04/06/23 Yes Royal Beirne, Eilleen Kempf, MD  sildenafil (VIAGRA) 100 MG tablet Take 0.5-1 tablets (50-100 mg total) by mouth daily as needed for erectile dysfunction. 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  No past surgical history on file.  Social History   Socioeconomic History   Marital status: Married    Spouse name: Not on file   Number of children: Not on file   Years of education: Not on file   Highest education level: Not on file  Occupational History   Not on file  Tobacco Use   Smoking status: Never   Smokeless tobacco: Never  Substance and Sexual Activity   Alcohol use: No   Drug use: No   Sexual activity: Not on file  Other Topics Concern   Not on file  Social History Narrative   Not on file   Social Determinants of Health   Financial  Resource Strain: Not on file  Food Insecurity: Patient Declined (03/29/2023)   Hunger Vital Sign    Worried About Running Out of Food in the Last Year: Patient declined    Ran Out of Food in the Last Year: Patient declined  Transportation Needs: Patient Declined (03/29/2023)   PRAPARE - Administrator, Civil Service (Medical): Patient declined    Lack of Transportation (Non-Medical): Patient declined  Physical Activity: Unknown (03/29/2023)   Exercise Vital Sign    Days of Exercise per Week: Patient declined    Minutes of Exercise per Session: Not on file  Stress: Patient Declined (03/29/2023)   [employer] of Occupational Health - Occupational Stress Questionnaire    Feeling of Stress : Patient declined  Social Connections: Unknown (03/29/2023)   Social Connection and Isolation Panel [NHANES]    Frequency of Communication with Friends and Family: Patient declined    Frequency of Social Gatherings with Friends and Family: Patient declined    Attends Religious Services: Patient declined    Database administrator or Organizations: Patient declined    Attends Banker Meetings: Not on file    Marital Status: Patient declined  Intimate Partner Violence: Not on file    No family history on file.   Review of Systems  Constitutional: Negative.  Negative for chills and fever.  HENT:  Negative for congestion and sore throat.   Respiratory: Negative.  Negative for cough and shortness of breath.   Cardiovascular: Negative.  Negative for chest pain.  Gastrointestinal:  Negative for abdominal pain, nausea and vomiting.  Skin:  Positive for rash.  Neurological: Negative.  Negative for dizziness and headaches.  All other systems reviewed and are negative.   Vitals:   03/30/23 0840  BP: 120/74  Pulse: 77  Temp: 98.6 F (37 C)  SpO2: 99%    Physical Exam Vitals reviewed.  Constitutional:      Appearance: Normal appearance.  HENT:     Head: Normocephalic.   Eyes:     Extraocular Movements: Extraocular movements intact.  Cardiovascular:     Rate and Rhythm: Normal rate.  Pulmonary:     Effort: Pulmonary effort is normal.  Skin:    General: Skin is warm and dry.     Comments: Left axillary area: Several areas of redness, tender to touch but no fluctuation. Hard inflamed areas  Neurological:     Mental Status: He is alert and oriented to person, place, and time.  Psychiatric:        Mood and Affect: Mood normal.        Behavior: Behavior normal.      ASSESSMENT & PLAN: Problem List Items Addressed This Visit       Musculoskeletal and Integument   Skin infection - Primary    History of MRSA infections Most likely recurrence.  No physical findings of abscess. No need for I&D at present time. Recommend to start doxycycline 100 mg twice a day for 7 days and continue warm compresses on and off during the day Take Tylenol and or Advil as needed for pain Recommend dermatology evaluation Referral placed today Advised to contact the office if no better or worse during the next several days      Relevant Medications   doxycycline (VIBRA-TABS) 100 MG tablet   Other Relevant Orders   Ambulatory referral to Dermatology     Other   History of MRSA infection   Relevant Medications   doxycycline (VIBRA-TABS) 100 MG tablet   Other Relevant Orders   Ambulatory referral to Dermatology   Patient Instructions  MRSA Infection, Diagnosis, Adult Methicillin-resistant Staphylococcus aureus (MRSA) infection is caused by bacteria called Staphylococcus aureus, or staph, that no longer respond to common antibiotic medicines (drug-resistant bacteria). MRSA infection can be hard to treat. Most of the time, MRSA can be on the skin or in the nose without causing problems (colonized). However, if MRSA enters the body through a cut, a sore, or an invasive medical device, it can cause a serious infection. What are the causes? This condition is caused by  staph bacteria. Illness may develop after exposure to the bacteria through: Skin-to-skin contact with someone who is infected with MRSA. Touching surfaces that have the bacteria on them. Having a procedure or using equipment that allows MRSA to enter the body. Having MRSA that lives on your skin and then enters your body through: A cut or scratch. A surgery or procedure. The use of a medical device. Contact with the bacteria may occur: During a stay in a hospital, rehabilitation facility, nursing home, or other health care facility (health care-associated MRSA). In daily activities where there is close contact with others, such as sports, child care centers, or at home (community-associated MRSA). What increases the risk? You are more likely to develop this condition if you: Have a surgery or procedure. Have  an IV or a thin tube (catheter) placed in your body. Are elderly. Are on kidney dialysis. Have recently taken an antibiotic medicine. Live in a long-term care facility. Have a chronic wound or skin ulcer. Have a weak body defense system (immune system). Play sports that involve skin-to-skin contact. Live in a crowded place, like a dormitory or [employer]. Share towels, razors, or sports equipment with other people. Have a history of MRSA infection or colonization. What are the signs or symptoms? Symptoms of this condition depend on the area that is affected. Symptoms may include: A pus-filled pimple or boil. Pus that drains from your skin. A sore (abscess) under your skin or somewhere in your body. Fever with or without chills. Difficulty breathing. Coughing up blood. Redness, warmth, swelling, or pain in the affected area. How is this diagnosed? This condition may be diagnosed based on: A physical exam. Your medical history. Taking a sample from the infected area and growing it in a lab (culture). You may also have other tests, including: Imaging tests, such as  X-rays, a CT scan, or an MRI. Lab tests, such as blood, urine, or phlegm (sputum) tests. You skin or nose may be swabbed when you are admitted to a health care facility for a procedure. This is to screen for MRSA. How is this treated? Treatment depends on the type of MRSA infection you have and how severe, deep, or extensive it is. Treatment may include: Antibiotic medicines. Surgery to drain pus from the infected area. Severe infections may require a hospital stay. Follow these instructions at home: Medicines Take over-the-counter and prescription medicines only as told by your health care provider. If you were prescribed an antibiotic medicine, use it as told by your health care provider. Do not stop using the antibiotic even if you start to feel better. Prevention Follow these instructions to avoid spreading the infection to others: Wash your hands frequently with soap and water. If soap and water are not available, use an alcohol-based hand sanitizer. Avoid close contact with those around you as much as possible. Do not use towels, razors, toothbrushes, bedding, or other items that will be used by others. Wash towels, bedding, and clothes in the washing machine with detergent and hot water. Dry them in a hot dryer. Clean surfaces regularly to remove germs (disinfection). Use products or solutions that contain bleach. Make sure you disinfect bathroom surfaces, food preparation areas, exercise equipment, and doorknobs.  General instructions If you have a wound, follow instructions from your health care provider about how to take care of your wound. Do not pick at scabs. Do not try to drain any infection sites or pimples. Tell all your health care providers that you have MRSA, or if you have ever had a MRSA infection. Keep all follow-up visits as told by your health care provider. This is important. Contact a health care provider if you: Do not get better. Have symptoms that get  worse. Have new symptoms. Get help right away if you have: Nausea or vomiting, or if you cannot take medicine without vomiting. Trouble breathing. Chest pain. These symptoms may represent a serious problem that is an emergency. Do not wait to see if the symptoms will go away. Get medical help right away. Call your local emergency services (911 in the [place] Do not drive yourself to the hospital. Summary MRSA infection is caused by bacteria called Staphylococcus aureus, or staph, that no longer respond to common antibiotic medicines. Treatment for this condition depends  on the type of MRSA infection you have and how severe, deep, and extensive it is. If you were prescribed an antibiotic medicine, use it as told by your health care provider. Do not stop using the antibiotic even if you start to feel better. Follow instructions from your health care provider to avoid spreading the infection to others. This information is not intended to replace advice given to you by your health care provider. Make sure you discuss any questions you have with your health care provider. Document Revised: 09/15/2022 Document Reviewed: 09/15/2022 Elsevier Patient Education  2023 Elsevier Inc.     Edwina Barth, MD [facility]

## 2023-03-30 NOTE — Assessment & Plan Note (Signed)
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  No physical findings of abscess. No need for I&D at present time. Recommend to start doxycycline 100 mg twice a day for 7 days and continue warm compresses on and off during the day Take Tylenol and or Advil as needed for pain Recommend dermatology evaluation Referral placed today Advised to contact the office if no better or worse during the next several days

## 2023-06-19 ENCOUNTER — Ambulatory Visit (INDEPENDENT_AMBULATORY_CARE_PROVIDER_SITE_OTHER): Payer: 59 | Admitting: Emergency Medicine

## 2023-06-19 ENCOUNTER — Encounter: Payer: Self-pay | Admitting: Emergency Medicine

## 2023-06-19 VITALS — BP 118/76 | HR 79 | Temp 98.4°F | Ht 71.0 in | Wt 226.0 lb

## 2023-06-19 DIAGNOSIS — Z13228 Encounter for screening for other metabolic disorders: Secondary | ICD-10-CM | POA: Diagnosis not present

## 2023-06-19 DIAGNOSIS — Z130 Encounter for screening for diseases of the blood and blood-forming organs and certain disorders involving the immune mechanism: Secondary | ICD-10-CM

## 2023-06-19 DIAGNOSIS — Z1329 Encounter for screening for other suspected endocrine disorder: Secondary | ICD-10-CM

## 2023-06-19 DIAGNOSIS — Z13220 Encounter for screening for lipoid disorders: Secondary | ICD-10-CM | POA: Diagnosis not present

## 2023-06-19 DIAGNOSIS — Z0000 Encounter for general adult medical examination without abnormal findings: Secondary | ICD-10-CM

## 2023-06-19 LAB — CBC WITH DIFFERENTIAL/PLATELET
Basophils Absolute: 0.1 10*3/uL (ref 0.0–0.1)
Basophils Relative: 1.3 % (ref 0.0–3.0)
Eosinophils Absolute: 0.2 10*3/uL (ref 0.0–0.7)
Eosinophils Relative: 2 % (ref 0.0–5.0)
HCT: 44.7 % (ref 39.0–52.0)
Hemoglobin: 14.9 g/dL (ref 13.0–17.0)
Lymphocytes Relative: 28.2 % (ref 12.0–46.0)
Lymphs Abs: 2.2 10*3/uL (ref 0.7–4.0)
MCHC: 33.4 g/dL (ref 30.0–36.0)
MCV: 93.9 fl (ref 78.0–100.0)
Monocytes Absolute: 0.8 10*3/uL (ref 0.1–1.0)
Monocytes Relative: 10.8 % (ref 3.0–12.0)
Neutro Abs: 4.4 10*3/uL (ref 1.4–7.7)
Neutrophils Relative %: 57.7 % (ref 43.0–77.0)
Platelets: 257 10*3/uL (ref 150.0–400.0)
RBC: 4.76 Mil/uL (ref 4.22–5.81)
RDW: 13.9 % (ref 11.5–15.5)
WBC: 7.7 10*3/uL (ref 4.0–10.5)

## 2023-06-19 LAB — COMPREHENSIVE METABOLIC PANEL
ALT: 23 U/L (ref 0–53)
AST: 21 U/L (ref 0–37)
Albumin: 4.4 g/dL (ref 3.5–5.2)
Alkaline Phosphatase: 81 U/L (ref 39–117)
BUN: 19 mg/dL (ref 6–23)
CO2: 27 mEq/L (ref 19–32)
Calcium: 9.4 mg/dL (ref 8.4–10.5)
Chloride: 104 mEq/L (ref 96–112)
Creatinine, Ser: 1.16 mg/dL (ref 0.40–1.50)
GFR: 79.4 mL/min (ref >60.00)
Glucose, Bld: 84 mg/dL (ref 70–99)
Potassium: 3.7 mEq/L (ref 3.5–5.1)
Sodium: 140 mEq/L (ref 135–145)
Total Bilirubin: 0.6 mg/dL (ref 0.2–1.2)
Total Protein: 7.4 g/dL (ref 6.0–8.3)

## 2023-06-19 LAB — LIPID PANEL
Cholesterol: 166 mg/dL (ref 0–200)
HDL: 48.4 mg/dL (ref >39.00)
LDL Cholesterol: 94 mg/dL (ref 0–99)
NonHDL: 117.9
Total CHOL/HDL Ratio: 3
Triglycerides: 120 mg/dL (ref 0.0–149.0)
VLDL: 24 mg/dL (ref 0.0–40.0)

## 2023-06-19 LAB — HEMOGLOBIN A1C: Hgb A1c MFr Bld: 5.6 % (ref 4.6–6.5)

## 2023-06-19 NOTE — Patient Instructions (Signed)
Health Maintenance, Male Adopting a healthy lifestyle and getting preventive care are important in promoting health and wellness. Ask your health care provider about: The right schedule for you to have regular tests and exams. Things you can do on your own to prevent diseases and keep yourself healthy. What should I know about diet, weight, and exercise? Eat a healthy diet  Eat a diet that includes plenty of vegetables, fruits, low-fat dairy products, and lean protein. Do not eat a lot of foods that are high in solid fats, added sugars, or sodium. Maintain a healthy weight Body mass index (BMI) is a measurement that can be used to identify possible weight problems. It estimates body fat based on height and weight. Your health care provider can help determine your BMI and help you achieve or maintain a healthy weight. Get regular exercise Get regular exercise. This is one of the most important things you can do for your health. Most adults should: Exercise for at least 150 minutes each week. The exercise should increase your heart rate and make you sweat (moderate-intensity exercise). Do strengthening exercises at least twice a week. This is in addition to the moderate-intensity exercise. Spend less time sitting. Even light physical activity can be beneficial. Watch cholesterol and blood lipids Have your blood tested for lipids and cholesterol at 39 years of age, then have this test every 5 years. You may need to have your cholesterol levels checked more often if: Your lipid or cholesterol levels are high. You are older than 40 years of age. You are at high risk for heart disease. What should I know about cancer screening? Many types of cancers can be detected early and may often be prevented. Depending on your health history and family history, you may need to have cancer screening at various ages. This may include screening for: Colorectal cancer. Prostate cancer. Skin cancer. Lung  cancer. What should I know about heart disease, diabetes, and high blood pressure? Blood pressure and heart disease High blood pressure causes heart disease and increases the risk of stroke. This is more likely to develop in people who have high blood pressure readings or are overweight. Talk with your health care provider about your target blood pressure readings. Have your blood pressure checked: Every 3-5 years if you are 18-39 years of age. Every year if you are 40 years old or older. If you are between the ages of 65 and 75 and are a current or former smoker, ask your health care provider if you should have a one-time screening for abdominal aortic aneurysm (AAA). Diabetes Have regular diabetes screenings. This checks your fasting blood sugar level. Have the screening done: Once every three years after age 45 if you are at a normal weight and have a low risk for diabetes. More often and at a younger age if you are overweight or have a high risk for diabetes. What should I know about preventing infection? Hepatitis B If you have a higher risk for hepatitis B, you should be screened for this virus. Talk with your health care provider to find out if you are at risk for hepatitis B infection. Hepatitis C Blood testing is recommended for: Everyone born from 1945 through 1965. Anyone with known risk factors for hepatitis C. Sexually transmitted infections (STIs) You should be screened each year for STIs, including gonorrhea and chlamydia, if: You are sexually active and are younger than 39 years of age. You are older than 39 years of age and your   health care provider tells you that you are at risk for this type of infection. Your sexual activity has changed since you were last screened, and you are at increased risk for chlamydia or gonorrhea. Ask your health care provider if you are at risk. Ask your health care provider about whether you are at high risk for HIV. Your health care provider  may recommend a prescription medicine to help prevent HIV infection. If you choose to take medicine to prevent HIV, you should first get tested for HIV. You should then be tested every 3 months for as long as you are taking the medicine. Follow these instructions at home: Alcohol use Do not drink alcohol if your health care provider tells you not to drink. If you drink alcohol: Limit how much you have to 0-2 drinks a day. Know how much alcohol is in your drink. In the [place] one drink equals one 12 oz bottle of beer (355 mL), one 5 oz glass of wine (148 mL), or one 1 oz glass of hard liquor (44 mL). Lifestyle Do not use any products that contain nicotine or tobacco. These products include cigarettes, chewing tobacco, and vaping devices, such as e-cigarettes. If you need help quitting, ask your health care provider. Do not use street drugs. Do not share needles. Ask your health care provider for help if you need support or information about quitting drugs. General instructions Schedule regular health, dental, and eye exams. Stay current with your vaccines. Tell your health care provider if: You often feel depressed. You have ever been abused or do not feel safe at home. Summary Adopting a healthy lifestyle and getting preventive care are important in promoting health and wellness. Follow your health care provider's instructions about healthy diet, exercising, and getting tested or screened for diseases. Follow your health care provider's instructions on monitoring your cholesterol and blood pressure. This information is not intended to replace advice given to you by your health care provider. Make sure you discuss any questions you have with your health care provider. Document Revised: 03/22/2021 Document Reviewed: 03/22/2021 Elsevier Patient Education  2024 Elsevier Inc.  

## 2023-06-19 NOTE — Progress Notes (Signed)
Keith Hamilton 37 y.o.   Chief Complaint  Patient presents with   Annual Exam    HISTORY OF PRESENT ILLNESS: This is a 37 y.o. male A1A here for annual exam Healthy male with a healthy lifestyle Non-smoker. On the road a lot, takeout/fast food source of frequent meals. No other complaints or medical concerns today.  HPI   Prior to Admission medications   Medication Sig Start Date End Date Taking? Authorizing Provider  sildenafil (VIAGRA) 100 MG tablet Take 0.5-1 tablets (50-100 mg total) by mouth daily as needed for erectile dysfunction. Patient not taking: Reported on 03/30/2023 01/25/22   Georgina Quint, MD    No Known Allergies  Patient Active Problem List   Diagnosis Date Noted   History of MRSA infection 01/09/2023   Erectile dysfunction 01/25/2022    No past medical history on file.  No past surgical history on file.  Social History   Socioeconomic History   Marital status: Married    Spouse name: Not on file   Number of children: Not on file   Years of education: Not on file   Highest education level: Not on file  Occupational History   Not on file  Tobacco Use   Smoking status: Never   Smokeless tobacco: Never  Substance and Sexual Activity   Alcohol use: No   Drug use: No   Sexual activity: Not on file  Other Topics Concern   Not on file  Social History Narrative   Not on file   Social Determinants of Health   Financial Resource Strain: Not on file  Food Insecurity: Patient Declined (03/29/2023)   Hunger Vital Sign    Worried About Running Out of Food in the Last Year: Patient declined    Ran Out of Food in the Last Year: Patient declined  Transportation Needs: Patient Declined (03/29/2023)   PRAPARE - Administrator, Civil Service (Medical): Patient declined    Lack of Transportation (Non-Medical): Patient declined  Physical Activity: Unknown (03/29/2023)   Exercise Vital Sign    Days of Exercise per Week: Patient  declined    Minutes of Exercise per Session: Not on file  Stress: Patient Declined (03/29/2023)   [employer] of Occupational Health - Occupational Stress Questionnaire    Feeling of Stress : Patient declined  Social Connections: Unknown (03/29/2023)   Social Connection and Isolation Panel [NHANES]    Frequency of Communication with Friends and Family: Patient declined    Frequency of Social Gatherings with Friends and Family: Patient declined    Attends Religious Services: Patient declined    Database administrator or Organizations: Patient declined    Attends Banker Meetings: Not on file    Marital Status: Patient declined  Intimate Partner Violence: Not on file    No family history on file.   Review of Systems  Constitutional: Negative.  Negative for chills and fever.  HENT: Negative.  Negative for congestion and sore throat.   Respiratory: Negative.  Negative for cough.   Cardiovascular: Negative.  Negative for chest pain and palpitations.  Gastrointestinal:  Negative for abdominal pain, nausea and vomiting.  Genitourinary: Negative.  Negative for dysuria and hematuria.  Skin: Negative.  Negative for rash.  Neurological: Negative.  Negative for dizziness and headaches.  All other systems reviewed and are negative.   Vitals:   06/19/23 0801  BP: 118/76  Pulse: 79  Temp: 98.4 F (36.9 C)  SpO2: 98%  Physical Exam Vitals reviewed.  Constitutional:      Appearance: Normal appearance.  HENT:     Head: Normocephalic.     Right Ear: Tympanic membrane, ear canal and external ear normal.     Left Ear: Tympanic membrane, ear canal and external ear normal.     Mouth/Throat:     Mouth: Mucous membranes are moist.     Pharynx: Oropharynx is clear.  Eyes:     Extraocular Movements: Extraocular movements intact.     Conjunctiva/sclera: Conjunctivae normal.     Pupils: Pupils are equal, round, and reactive to light.  Cardiovascular:     Rate and Rhythm:  Normal rate and regular rhythm.     Pulses: Normal pulses.     Heart sounds: Normal heart sounds.  Pulmonary:     Effort: Pulmonary effort is normal.     Breath sounds: Normal breath sounds.  Abdominal:     Palpations: Abdomen is soft.     Tenderness: There is no abdominal tenderness.  Musculoskeletal:     Cervical back: No tenderness.  Lymphadenopathy:     Cervical: No cervical adenopathy.  Skin:    General: Skin is warm and dry.     Capillary Refill: Capillary refill takes less than 2 seconds.  Neurological:     General: No focal deficit present.     Mental Status: He is alert and oriented to person, place, and time.  Psychiatric:        Mood and Affect: Mood normal.        Behavior: Behavior normal.      ASSESSMENT & PLAN: Problem List Items Addressed This Visit   None Visit Diagnoses     Routine general medical examination at a health care facility    -  Primary   Relevant Orders   CBC with Differential   Comprehensive metabolic panel   Hemoglobin A1c   Lipid panel   Screening for deficiency anemia       Relevant Orders   CBC with Differential   Screening for lipoid disorders       Relevant Orders   Lipid panel   Screening for endocrine, metabolic and immunity disorder       Relevant Orders   Comprehensive metabolic panel   Hemoglobin A1c      Modifiable risk factors discussed with patient. Anticipatory guidance according to age provided. The following topics were also discussed: Social Determinants of Health Smoking.  Non-smoker Diet and nutrition and need to decrease amount of daily carbohydrate intake and daily calories and increase amount of plant-based protein in his diet Benefits of exercise Cancer family history review Vaccinations reviewed and recommendations Cardiovascular risk assessment and need for blood work Mental health including depression and anxiety Fall and accident prevention  Patient Instructions  Health Maintenance,  Male Adopting a healthy lifestyle and getting preventive care are important in promoting health and wellness. Ask your health care provider about: The right schedule for you to have regular tests and exams. Things you can do on your own to prevent diseases and keep yourself healthy. What should I know about diet, weight, and exercise? Eat a healthy diet  Eat a diet that includes plenty of vegetables, fruits, low-fat dairy products, and lean protein. Do not eat a lot of foods that are high in solid fats, added sugars, or sodium. Maintain a healthy weight Body mass index (BMI) is a measurement that can be used to identify possible weight problems. It estimates body fat based  on height and weight. Your health care provider can help determine your BMI and help you achieve or maintain a healthy weight. Get regular exercise Get regular exercise. This is one of the most important things you can do for your health. Most adults should: Exercise for at least 150 minutes each week. The exercise should increase your heart rate and make you sweat (moderate-intensity exercise). Do strengthening exercises at least twice a week. This is in addition to the moderate-intensity exercise. Spend less time sitting. Even light physical activity can be beneficial. Watch cholesterol and blood lipids Have your blood tested for lipids and cholesterol at 39 years of age, then have this test every 5 years. You may need to have your cholesterol levels checked more often if: Your lipid or cholesterol levels are high. You are older than 39 years of age. You are at high risk for heart disease. What should I know about cancer screening? Many types of cancers can be detected early and may often be prevented. Depending on your health history and family history, you may need to have cancer screening at various ages. This may include screening for: Colorectal cancer. Prostate cancer. Skin cancer. Lung cancer. What should I  know about heart disease, diabetes, and high blood pressure? Blood pressure and heart disease High blood pressure causes heart disease and increases the risk of stroke. This is more likely to develop in people who have high blood pressure readings or are overweight. Talk with your health care provider about your target blood pressure readings. Have your blood pressure checked: Every 3-5 years if you are 60-50 years of age. Every year if you are 70 years old or older. If you are between the ages of 68 and 37 and are a current or former smoker, ask your health care provider if you should have a one-time screening for abdominal aortic aneurysm (AAA). Diabetes Have regular diabetes screenings. This checks your fasting blood sugar level. Have the screening done: Once every three years after age 59 if you are at a normal weight and have a low risk for diabetes. More often and at a younger age if you are overweight or have a high risk for diabetes. What should I know about preventing infection? Hepatitis B If you have a higher risk for hepatitis B, you should be screened for this virus. Talk with your health care provider to find out if you are at risk for hepatitis B infection. Hepatitis C Blood testing is recommended for: Everyone born from [birth year] through 1967. Anyone with known risk factors for hepatitis C. Sexually transmitted infections (STIs) You should be screened each year for STIs, including gonorrhea and chlamydia, if: You are sexually active and are younger than 39 years of age. You are older than 39 years of age and your health care provider tells you that you are at risk for this type of infection. Your sexual activity has changed since you were last screened, and you are at increased risk for chlamydia or gonorrhea. Ask your health care provider if you are at risk. Ask your health care provider about whether you are at high risk for HIV. Your health care provider may recommend a  prescription medicine to help prevent HIV infection. If you choose to take medicine to prevent HIV, you should first get tested for HIV. You should then be tested every 3 months for as long as you are taking the medicine. Follow these instructions at home: Alcohol use Do not drink alcohol if your  health care provider tells you not to drink. If you drink alcohol: Limit how much you have to 0-2 drinks a day. Know how much alcohol is in your drink. In the [place] one drink equals one 12 oz bottle of beer (355 mL), one 5 oz glass of wine (148 mL), or one 1 oz glass of hard liquor (44 mL). Lifestyle Do not use any products that contain nicotine or tobacco. These products include cigarettes, chewing tobacco, and vaping devices, such as e-cigarettes. If you need help quitting, ask your health care provider. Do not use street drugs. Do not share needles. Ask your health care provider for help if you need support or information about quitting drugs. General instructions Schedule regular health, dental, and eye exams. Stay current with your vaccines. Tell your health care provider if: You often feel depressed. You have ever been abused or do not feel safe at home. Summary Adopting a healthy lifestyle and getting preventive care are important in promoting health and wellness. Follow your health care provider's instructions about healthy diet, exercising, and getting tested or screened for diseases. Follow your health care provider's instructions on monitoring your cholesterol and blood pressure. This information is not intended to replace advice given to you by your health care provider. Make sure you discuss any questions you have with your health care provider. Document Revised: 03/22/2021 Document Reviewed: 03/22/2021 Elsevier Patient Education  2024 Elsevier Inc.     Edwina Barth, MD [facility]

## 2023-08-04 IMAGING — US US SOFT TISSUE
1 series · 14 of 16 positions shown · non-contrast
Comparison: None.

CLINICAL DATA: Enlarging mass in the right supraclavicular region.

EXAM:
ULTRASOUND OF RIGHT SUPRACLAVICULAR SOFT TISSUES
TECHNIQUE: Ultrasound examination was performed in the area of clinical
concern.

[Series 1: us soft tissue · 0.09mm/px · 19 acquisitions, 14 frames shown]
[im 1/19]
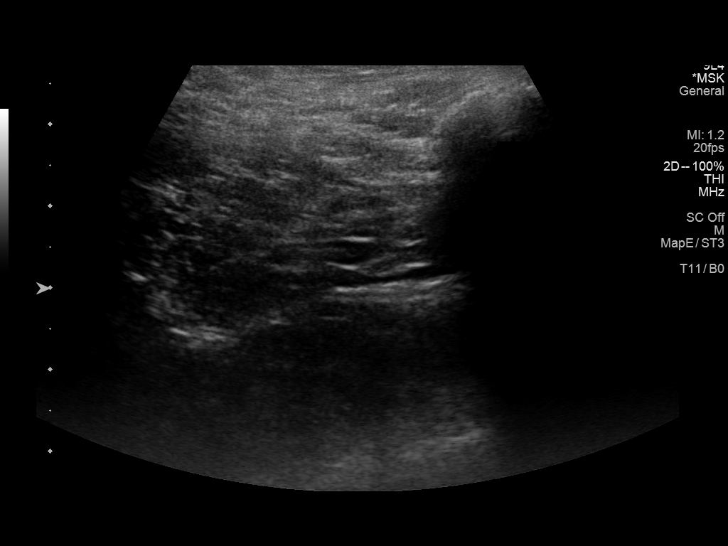
[im 2/19]
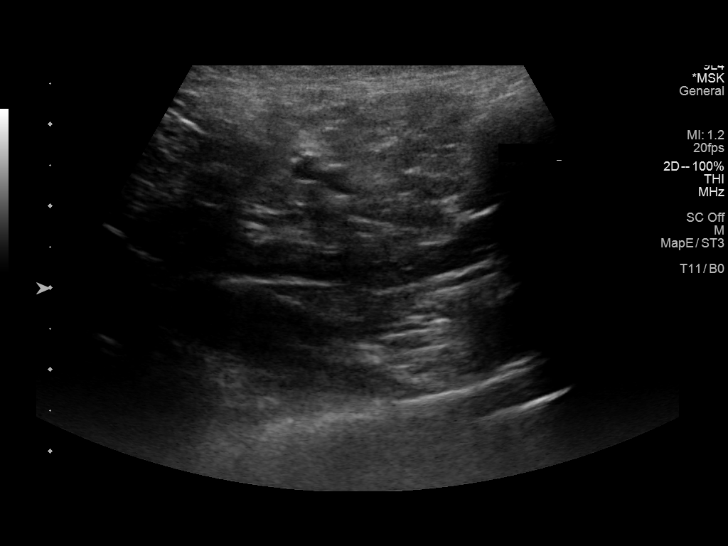
[im 3/19]
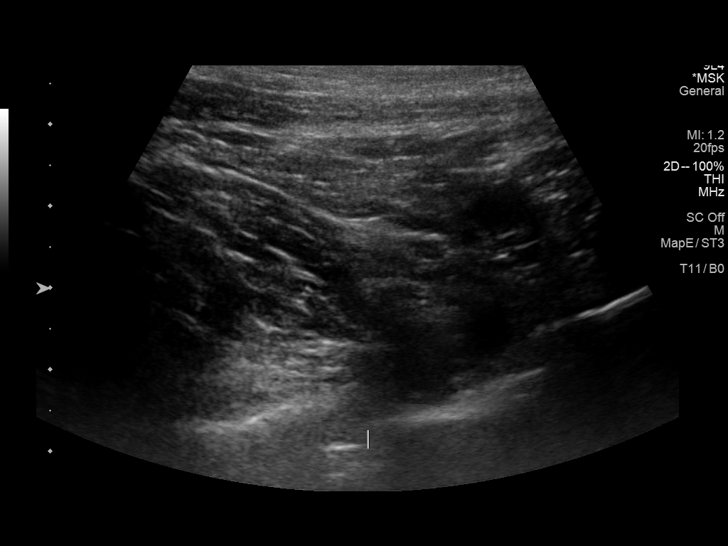
[im 5/19]
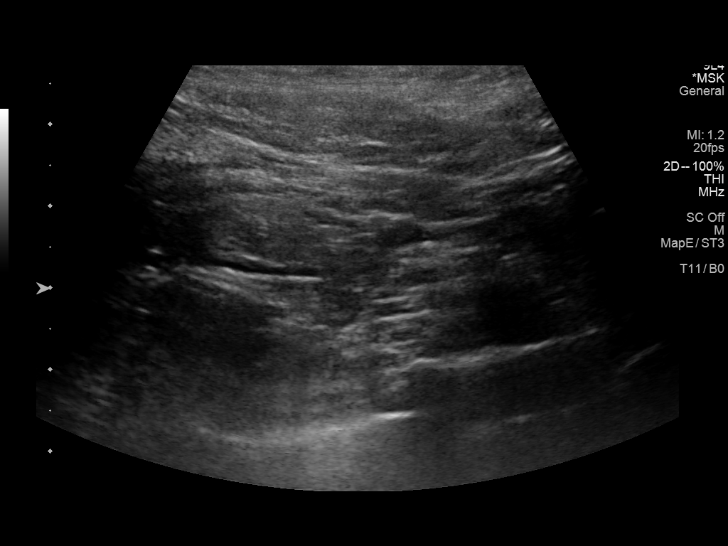
[im 7/19]
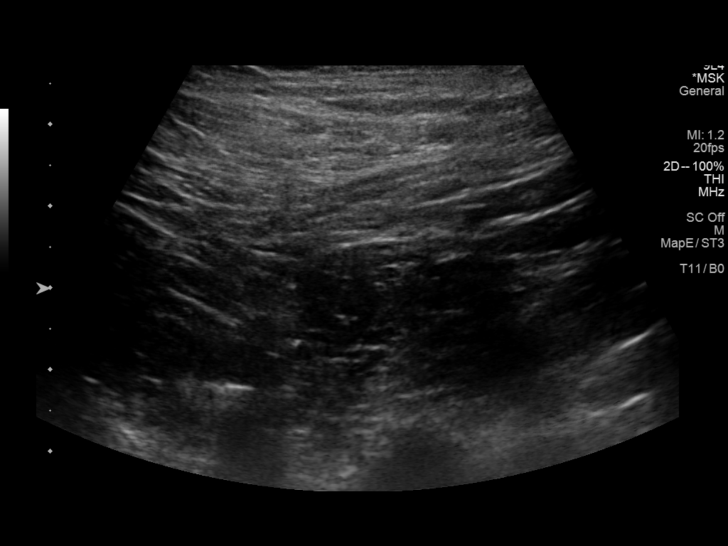
[im 8/19]
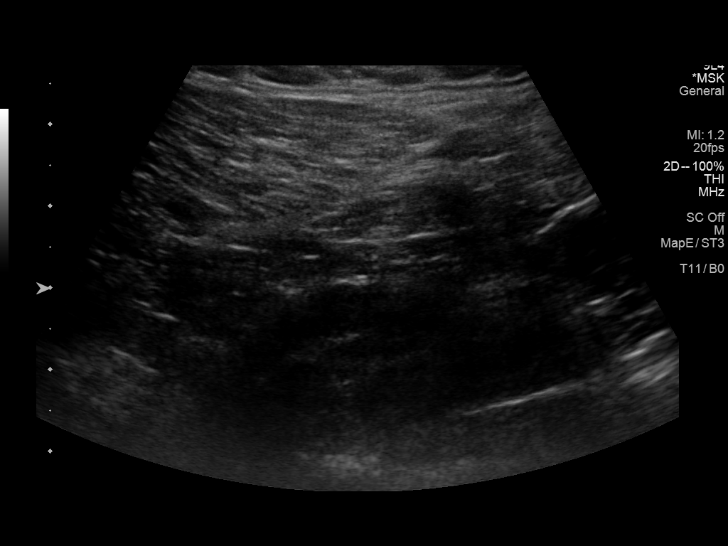
[im 9/19]
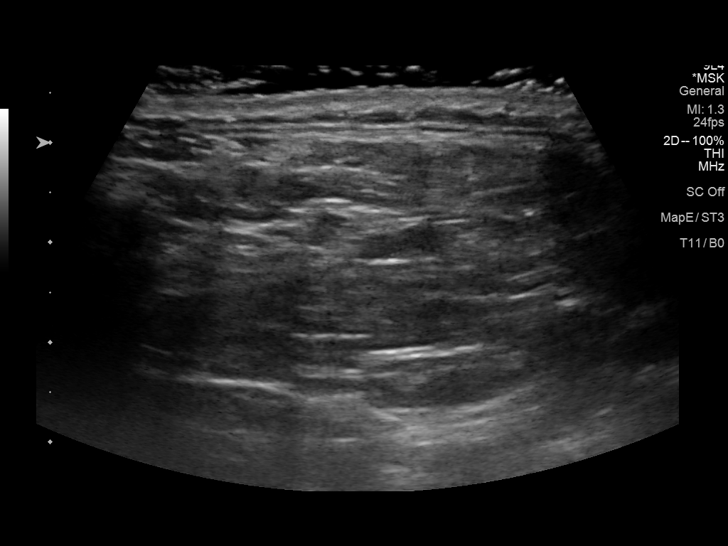
[im 10/19]
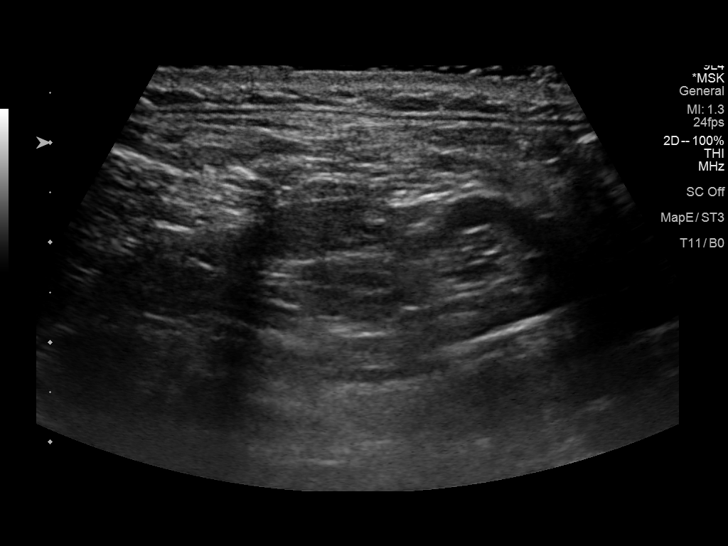
[im 11/19]
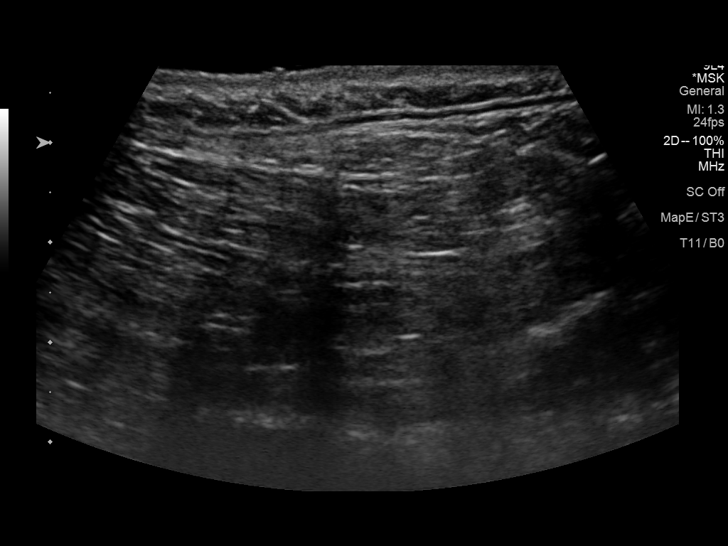
[im 13/19]
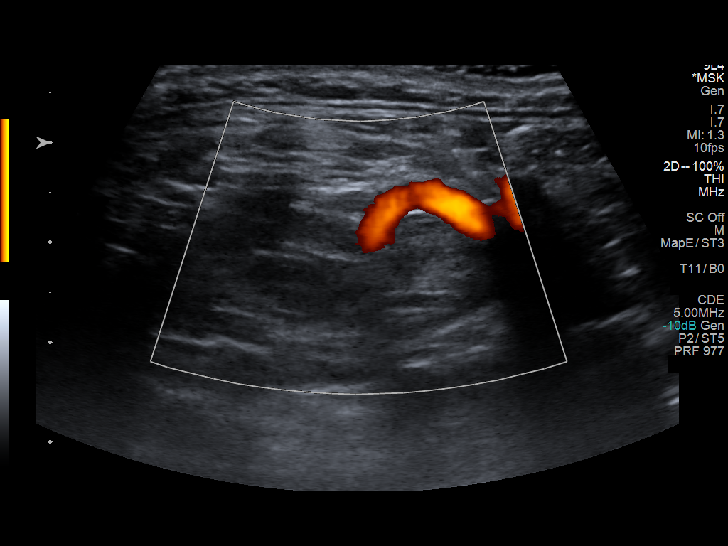
[im 15/19]
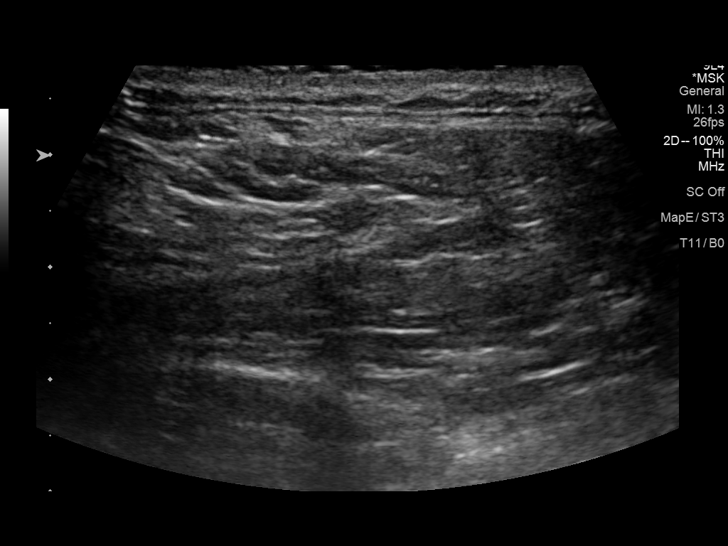
[im 16/19]
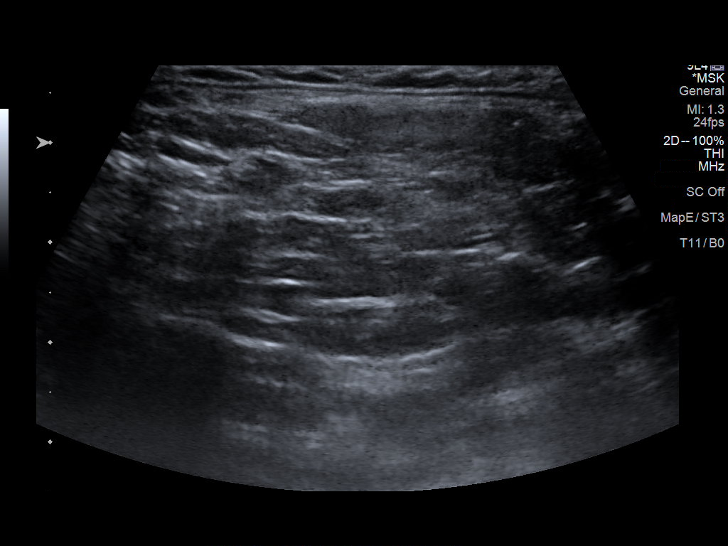
[im 17/19]
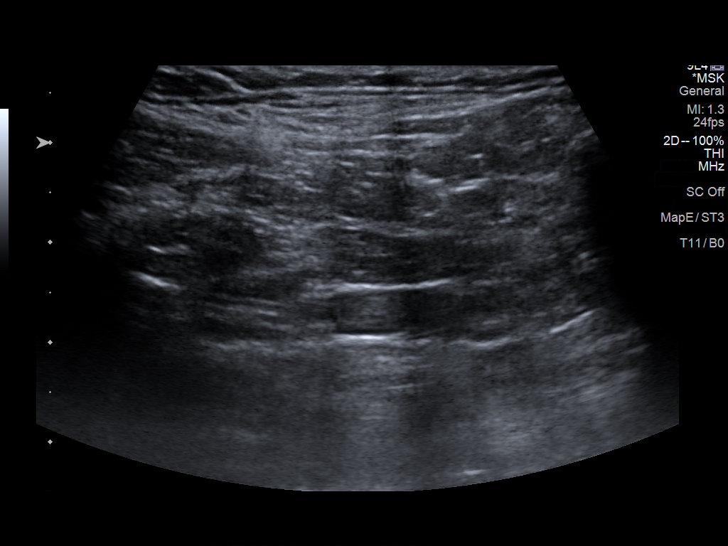
[im 19/19]
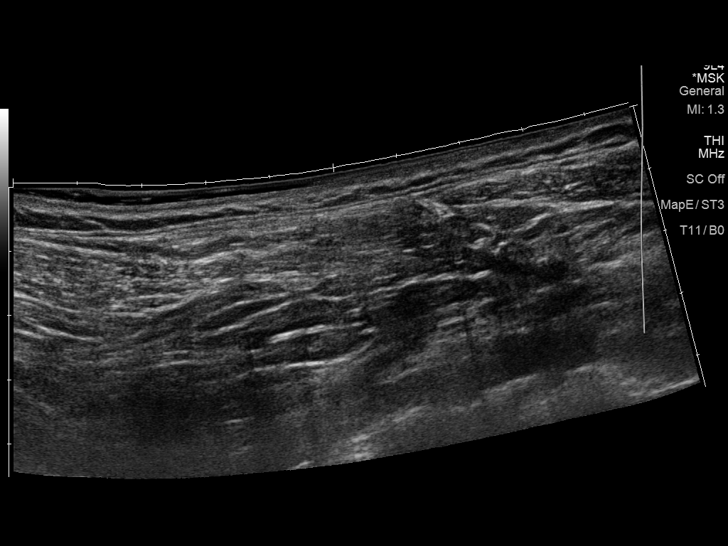

[14 of 16 positions shown; findings below may reference images not displayed]

FINDINGS: No discrete mass or cause for the patient's symptoms identified.
IMPRESSION: No discrete mass or cause for the patient's symptoms identified.
Cross-sectional imaging, either CT or MRI, may better evaluate this
region if concern persists.

## 2024-06-18 ENCOUNTER — Encounter: Payer: Self-pay | Admitting: Emergency Medicine

## 2024-06-18 ENCOUNTER — Ambulatory Visit (INDEPENDENT_AMBULATORY_CARE_PROVIDER_SITE_OTHER): Payer: 59 | Admitting: Emergency Medicine

## 2024-06-18 ENCOUNTER — Ambulatory Visit: Payer: Self-pay | Admitting: Emergency Medicine

## 2024-06-18 VITALS — BP 124/82 | HR 80 | Temp 98.2°F | Ht 71.0 in | Wt 226.0 lb

## 2024-06-18 DIAGNOSIS — Z13220 Encounter for screening for lipoid disorders: Secondary | ICD-10-CM

## 2024-06-18 DIAGNOSIS — Z131 Encounter for screening for diabetes mellitus: Secondary | ICD-10-CM

## 2024-06-18 DIAGNOSIS — Z0000 Encounter for general adult medical examination without abnormal findings: Secondary | ICD-10-CM | POA: Diagnosis not present

## 2024-06-18 DIAGNOSIS — Z130 Encounter for screening for diseases of the blood and blood-forming organs and certain disorders involving the immune mechanism: Secondary | ICD-10-CM

## 2024-06-18 DIAGNOSIS — Z13228 Encounter for screening for other metabolic disorders: Secondary | ICD-10-CM

## 2024-06-18 DIAGNOSIS — Z1329 Encounter for screening for other suspected endocrine disorder: Secondary | ICD-10-CM

## 2024-06-18 LAB — COMPREHENSIVE METABOLIC PANEL WITH GFR
ALT: 31 U/L (ref 0–53)
AST: 25 U/L (ref 0–37)
Albumin: 4.5 g/dL (ref 3.5–5.2)
Alkaline Phosphatase: 74 U/L (ref 39–117)
BUN: 18 mg/dL (ref 6–23)
CO2: 28 meq/L (ref 19–32)
Calcium: 9.7 mg/dL (ref 8.4–10.5)
Chloride: 103 meq/L (ref 96–112)
Creatinine, Ser: 1.05 mg/dL (ref 0.40–1.50)
GFR: 88.86 mL/min (ref >60.00)
Glucose, Bld: 98 mg/dL (ref 70–99)
Potassium: 4 meq/L (ref 3.5–5.1)
Sodium: 137 meq/L (ref 135–145)
Total Bilirubin: 0.6 mg/dL (ref 0.2–1.2)
Total Protein: 7.6 g/dL (ref 6.0–8.3)

## 2024-06-18 LAB — CBC WITH DIFFERENTIAL/PLATELET
Basophils Absolute: 0.1 K/uL (ref 0.0–0.1)
Basophils Relative: 1.5 % (ref 0.0–3.0)
Eosinophils Absolute: 0.2 K/uL (ref 0.0–0.7)
Eosinophils Relative: 2.8 % (ref 0.0–5.0)
HCT: 45.5 % (ref 39.0–52.0)
Hemoglobin: 15.2 g/dL (ref 13.0–17.0)
Lymphocytes Relative: 27.6 % (ref 12.0–46.0)
Lymphs Abs: 2.1 K/uL (ref 0.7–4.0)
MCHC: 33.4 g/dL (ref 30.0–36.0)
MCV: 92.9 fl (ref 78.0–100.0)
Monocytes Absolute: 0.8 K/uL (ref 0.1–1.0)
Monocytes Relative: 10.8 % (ref 3.0–12.0)
Neutro Abs: 4.4 K/uL (ref 1.4–7.7)
Neutrophils Relative %: 57.3 % (ref 43.0–77.0)
Platelets: 245 K/uL (ref 150.0–400.0)
RBC: 4.9 Mil/uL (ref 4.22–5.81)
RDW: 14.1 % (ref 11.5–15.5)
WBC: 7.6 K/uL (ref 4.0–10.5)

## 2024-06-18 LAB — LIPID PANEL
Cholesterol: 182 mg/dL (ref 0–200)
HDL: 48.2 mg/dL (ref >39.00)
LDL Cholesterol: 110 mg/dL — ABNORMAL HIGH (ref 0–99)
NonHDL: 133.51
Total CHOL/HDL Ratio: 4
Triglycerides: 117 mg/dL (ref 0.0–149.0)
VLDL: 23.4 mg/dL (ref 0.0–40.0)

## 2024-06-18 LAB — HEMOGLOBIN A1C: Hgb A1c MFr Bld: 6 % (ref 4.6–6.5)

## 2024-06-18 NOTE — Progress Notes (Signed)
 Keith Hamilton 38 y.o.   Chief Complaint  Patient presents with   Annual Exam    Patient here for physical. No other concerns     HISTORY OF PRESENT ILLNESS: This is a 38 y.o. male here for annual physical. Has no complaints or medical concerns today.  HPI   Prior to Admission medications   Medication Sig Start Date End Date Taking? Authorizing Provider  sildenafil  (VIAGRA ) 100 MG tablet Take 0.5-1 tablets (50-100 mg total) by mouth daily as needed for erectile dysfunction. Patient not taking: Reported on 06/18/2024 01/25/22   Purcell Emil Schanz, MD    No Known Allergies  Patient Active Problem List   Diagnosis Date Noted   History of MRSA infection 01/09/2023   Erectile dysfunction 01/25/2022    No past medical history on file.  No past surgical history on file.  Social History   Socioeconomic History   Marital status: Married    Spouse name: Not on file   Number of children: Not on file   Years of education: Not on file   Highest education level: 12th grade  Occupational History   Not on file  Tobacco Use   Smoking status: Never   Smokeless tobacco: Never  Substance and Sexual Activity   Alcohol use: No   Drug use: No   Sexual activity: Not on file  Other Topics Concern   Not on file  Social History Narrative   Not on file   Social Drivers of Health   Financial Resource Strain: Low Risk  (06/17/2024)   Overall Financial Resource Strain (CARDIA)    Difficulty of Paying Living Expenses: Not very hard  Food Insecurity: No Food Insecurity (06/17/2024)   Hunger Vital Sign    Worried About Running Out of Food in the Last Year: Never true    Ran Out of Food in the Last Year: Never true  Transportation Needs: No Transportation Needs (06/17/2024)   PRAPARE - Administrator, Civil Service (Medical): No    Lack of Transportation (Non-Medical): No  Physical Activity: Insufficiently Active (06/17/2024)   Exercise Vital Sign    Days of Exercise per  Week: 3 days    Minutes of Exercise per Session: 30 min  Stress: No Stress Concern Present (06/17/2024)   [employer] of Occupational Health - Occupational Stress Questionnaire    Feeling of Stress: Only a little  Social Connections: Socially Integrated (06/17/2024)   Social Connection and Isolation Panel    Frequency of Communication with Friends and Family: Twice a week    Frequency of Social Gatherings with Friends and Family: Once a week    Attends Religious Services: More than 4 times per year    Active Member of [employer] or Organizations: Yes    Attends Engineer, structural: More than 4 times per year    Marital Status: Married  Catering manager Violence: Not on file    No family history on file.   Review of Systems  Constitutional: Negative.  Negative for chills and fever.  HENT: Negative.  Negative for congestion and sore throat.   Respiratory: Negative.  Negative for cough and shortness of breath.   Cardiovascular: Negative.  Negative for chest pain and palpitations.  Gastrointestinal:  Negative for abdominal pain, diarrhea, nausea and vomiting.  Genitourinary: Negative.  Negative for dysuria and hematuria.  Musculoskeletal: Negative.   Skin: Negative.  Negative for rash.  Neurological: Negative.  Negative for dizziness and headaches.  All other systems  reviewed and are negative.   Vitals:   06/18/24 0838  BP: 124/82  Pulse: 80  Temp: 98.2 F (36.8 C)  SpO2: 98%    Physical Exam Vitals reviewed.  Constitutional:      Appearance: Normal appearance.  HENT:     Head: Normocephalic.     Right Ear: Tympanic membrane, ear canal and external ear normal.     Left Ear: Tympanic membrane, ear canal and external ear normal.     Mouth/Throat:     Mouth: Mucous membranes are moist.     Pharynx: Oropharynx is clear.  Eyes:     Extraocular Movements: Extraocular movements intact.     Conjunctiva/sclera: Conjunctivae normal.     Pupils: Pupils are equal, round,  and reactive to light.  Cardiovascular:     Rate and Rhythm: Normal rate and regular rhythm.     Pulses: Normal pulses.     Heart sounds: Normal heart sounds.  Pulmonary:     Effort: Pulmonary effort is normal.     Breath sounds: Normal breath sounds.  Abdominal:     Palpations: Abdomen is soft.     Tenderness: There is no abdominal tenderness.  Musculoskeletal:     Cervical back: No tenderness.  Lymphadenopathy:     Cervical: No cervical adenopathy.  Skin:    General: Skin is warm and dry.     Capillary Refill: Capillary refill takes less than 2 seconds.  Neurological:     General: No focal deficit present.     Mental Status: He is alert and oriented to person, place, and time.  Psychiatric:        Mood and Affect: Mood normal.        Behavior: Behavior normal.      ASSESSMENT & PLAN: Problem List Items Addressed This Visit   None Visit Diagnoses       Routine general medical examination at a health care facility    -  Primary   Relevant Orders   CBC with Differential/Platelet   Comprehensive metabolic panel with GFR   Hemoglobin A1c   Lipid panel     Screening for deficiency anemia       Relevant Orders   CBC with Differential/Platelet     Screening for lipoid disorders       Relevant Orders   Lipid panel     Screening for endocrine, metabolic and immunity disorder       Relevant Orders   Comprehensive metabolic panel with GFR   Hemoglobin A1c     Modifiable risk factors discussed with patient. Anticipatory guidance according to age provided. The following topics were also discussed: Social Determinants of Health Smoking.  Non-smoker Diet and nutrition Benefits of exercise Cancer family history review Vaccinations review and recommendations Cardiovascular risk assessment and need for blood work Mental health including depression and anxiety Fall and accident prevention  Patient Instructions  Health Maintenance, Male Adopting a healthy lifestyle and  getting preventive care are important in promoting health and wellness. Ask your health care provider about: The right schedule for you to have regular tests and exams. Things you can do on your own to prevent diseases and keep yourself healthy. What should I know about diet, weight, and exercise? Eat a healthy diet  Eat a diet that includes plenty of vegetables, fruits, low-fat dairy products, and lean protein. Do not eat a lot of foods that are high in solid fats, added sugars, or sodium. Maintain a healthy weight Body mass  index (BMI) is a measurement that can be used to identify possible weight problems. It estimates body fat based on height and weight. Your health care provider can help determine your BMI and help you achieve or maintain a healthy weight. Get regular exercise Get regular exercise. This is one of the most important things you can do for your health. Most adults should: Exercise for at least 150 minutes each week. The exercise should increase your heart rate and make you sweat (moderate-intensity exercise). Do strengthening exercises at least twice a week. This is in addition to the moderate-intensity exercise. Spend less time sitting. Even light physical activity can be beneficial. Watch cholesterol and blood lipids Have your blood tested for lipids and cholesterol at 40 years of age, then have this test every 5 years. You may need to have your cholesterol levels checked more often if: Your lipid or cholesterol levels are high. You are older than 40 years of age. You are at high risk for heart disease. What should I know about cancer screening? Many types of cancers can be detected early and may often be prevented. Depending on your health history and family history, you may need to have cancer screening at various ages. This may include screening for: Colorectal cancer. Prostate cancer. Skin cancer. Lung cancer. What should I know about heart disease, diabetes, and  high blood pressure? Blood pressure and heart disease High blood pressure causes heart disease and increases the risk of stroke. This is more likely to develop in people who have high blood pressure readings or are overweight. Talk with your health care provider about your target blood pressure readings. Have your blood pressure checked: Every 3-5 years if you are 31-23 years of age. Every year if you are 80 years old or older. If you are between the ages of 37 and 72 and are a current or former smoker, ask your health care provider if you should have a one-time screening for abdominal aortic aneurysm (AAA). Diabetes Have regular diabetes screenings. This checks your fasting blood sugar level. Have the screening done: Once every three years after age 80 if you are at a normal weight and have a low risk for diabetes. More often and at a younger age if you are overweight or have a high risk for diabetes. What should I know about preventing infection? Hepatitis B If you have a higher risk for hepatitis B, you should be screened for this virus. Talk with your health care provider to find out if you are at risk for hepatitis B infection. Hepatitis C Blood testing is recommended for: Everyone born from [birth year] through 1965. Anyone with known risk factors for hepatitis C. Sexually transmitted infections (STIs) You should be screened each year for STIs, including gonorrhea and chlamydia, if: You are sexually active and are younger than 40 years of age. You are older than 40 years of age and your health care provider tells you that you are at risk for this type of infection. Your sexual activity has changed since you were last screened, and you are at increased risk for chlamydia or gonorrhea. Ask your health care provider if you are at risk. Ask your health care provider about whether you are at high risk for HIV. Your health care provider may recommend a prescription medicine to help prevent HIV  infection. If you choose to take medicine to prevent HIV, you should first get tested for HIV. You should then be tested every 3 months for as long  as you are taking the medicine. Follow these instructions at home: Alcohol use Do not drink alcohol if your health care provider tells you not to drink. If you drink alcohol: Limit how much you have to 0-2 drinks a day. Know how much alcohol is in your drink. In the [place] one drink equals one 12 oz bottle of beer (355 mL), one 5 oz glass of wine (148 mL), or one 1 oz glass of hard liquor (44 mL). Lifestyle Do not use any products that contain nicotine or tobacco. These products include cigarettes, chewing tobacco, and vaping devices, such as e-cigarettes. If you need help quitting, ask your health care provider. Do not use street drugs. Do not share needles. Ask your health care provider for help if you need support or information about quitting drugs. General instructions Schedule regular health, dental, and eye exams. Stay current with your vaccines. Tell your health care provider if: You often feel depressed. You have ever been abused or do not feel safe at home. Summary Adopting a healthy lifestyle and getting preventive care are important in promoting health and wellness. Follow your health care provider's instructions about healthy diet, exercising, and getting tested or screened for diseases. Follow your health care provider's instructions on monitoring your cholesterol and blood pressure. This information is not intended to replace advice given to you by your health care provider. Make sure you discuss any questions you have with your health care provider. Document Revised: 03/22/2021 Document Reviewed: 03/22/2021 Elsevier Patient Education  2024 Elsevier Inc.     Emil Schaumann, MD [facility]

## 2024-06-18 NOTE — Patient Instructions (Signed)
 Health Maintenance, Male  Adopting a healthy lifestyle and getting preventive care are important in promoting health and wellness. Ask your health care provider about:  The right schedule for you to have regular tests and exams.  Things you can do on your own to prevent diseases and keep yourself healthy.  What should I know about diet, weight, and exercise?  Eat a healthy diet    Eat a diet that includes plenty of vegetables, fruits, low-fat dairy products, and lean protein.  Do not eat a lot of foods that are high in solid fats, added sugars, or sodium.  Maintain a healthy weight  Body mass index (BMI) is a measurement that can be used to identify possible weight problems. It estimates body fat based on height and weight. Your health care provider can help determine your BMI and help you achieve or maintain a healthy weight.  Get regular exercise  Get regular exercise. This is one of the most important things you can do for your health. Most adults should:  Exercise for at least 150 minutes each week. The exercise should increase your heart rate and make you sweat (moderate-intensity exercise).  Do strengthening exercises at least twice a week. This is in addition to the moderate-intensity exercise.  Spend less time sitting. Even light physical activity can be beneficial.  Watch cholesterol and blood lipids  Have your blood tested for lipids and cholesterol at 40 years of age, then have this test every 5 years.  You may need to have your cholesterol levels checked more often if:  Your lipid or cholesterol levels are high.  You are older than 40 years of age.  You are at high risk for heart disease.  What should I know about cancer screening?  Many types of cancers can be detected early and may often be prevented. Depending on your health history and family history, you may need to have cancer screening at various ages. This may include screening for:  Colorectal cancer.  Prostate cancer.  Skin cancer.  Lung  cancer.  What should I know about heart disease, diabetes, and high blood pressure?  Blood pressure and heart disease  High blood pressure causes heart disease and increases the risk of stroke. This is more likely to develop in people who have high blood pressure readings or are overweight.  Talk with your health care provider about your target blood pressure readings.  Have your blood pressure checked:  Every 3-5 years if you are 9-95 years of age.  Every year if you are 85 years old or older.  If you are between the ages of 29 and 29 and are a current or former smoker, ask your health care provider if you should have a one-time screening for abdominal aortic aneurysm (AAA).  Diabetes  Have regular diabetes screenings. This checks your fasting blood sugar level. Have the screening done:  Once every three years after age 23 if you are at a normal weight and have a low risk for diabetes.  More often and at a younger age if you are overweight or have a high risk for diabetes.  What should I know about preventing infection?  Hepatitis B  If you have a higher risk for hepatitis B, you should be screened for this virus. Talk with your health care provider to find out if you are at risk for hepatitis B infection.  Hepatitis C  Blood testing is recommended for:  Everyone born from [birth year] through 1967.  Anyone  with known risk factors for hepatitis C.  Sexually transmitted infections (STIs)  You should be screened each year for STIs, including gonorrhea and chlamydia, if:  You are sexually active and are younger than 40 years of age.  You are older than 40 years of age and your health care provider tells you that you are at risk for this type of infection.  Your sexual activity has changed since you were last screened, and you are at increased risk for chlamydia or gonorrhea. Ask your health care provider if you are at risk.  Ask your health care provider about whether you are at high risk for HIV. Your health care provider  may recommend a prescription medicine to help prevent HIV infection. If you choose to take medicine to prevent HIV, you should first get tested for HIV. You should then be tested every 3 months for as long as you are taking the medicine.  Follow these instructions at home:  Alcohol use  Do not drink alcohol if your health care provider tells you not to drink.  If you drink alcohol:  Limit how much you have to 0-2 drinks a day.  Know how much alcohol is in your drink. In the [place] one drink equals one 12 oz bottle of beer (355 mL), one 5 oz glass of wine (148 mL), or one 1 oz glass of hard liquor (44 mL).  Lifestyle  Do not use any products that contain nicotine or tobacco. These products include cigarettes, chewing tobacco, and vaping devices, such as e-cigarettes. If you need help quitting, ask your health care provider.  Do not use street drugs.  Do not share needles.  Ask your health care provider for help if you need support or information about quitting drugs.  General instructions  Schedule regular health, dental, and eye exams.  Stay current with your vaccines.  Tell your health care provider if:  You often feel depressed.  You have ever been abused or do not feel safe at home.  Summary  Adopting a healthy lifestyle and getting preventive care are important in promoting health and wellness.  Follow your health care provider's instructions about healthy diet, exercising, and getting tested or screened for diseases.  Follow your health care provider's instructions on monitoring your cholesterol and blood pressure.  This information is not intended to replace advice given to you by your health care provider. Make sure you discuss any questions you have with your health care provider.  Document Revised: 03/22/2021 Document Reviewed: 03/22/2021  [employer]vier Patient Education  2024 [employer].

## 2025-06-19 ENCOUNTER — Encounter: Admitting: Emergency Medicine
